# Patient Record
Sex: Male | Born: 1985 | Race: Black or African American | Hispanic: No | Marital: Married | Smoking: Never smoker
Health system: Southern US, Community
[De-identification: ages and names within clinical notes are randomized; demographics above are authoritative.]

## PROBLEM LIST (undated history)

## (undated) HISTORY — PX: NO PAST SURGERIES: SHX2092

---

## 2014-12-20 ENCOUNTER — Other Ambulatory Visit: Payer: Self-pay | Admitting: Internal Medicine

## 2014-12-20 DIAGNOSIS — R7989 Other specified abnormal findings of blood chemistry: Secondary | ICD-10-CM

## 2014-12-20 DIAGNOSIS — R945 Abnormal results of liver function studies: Principal | ICD-10-CM

## 2014-12-21 ENCOUNTER — Ambulatory Visit: Payer: Self-pay

## 2014-12-29 ENCOUNTER — Other Ambulatory Visit: Payer: Self-pay

## 2014-12-30 ENCOUNTER — Ambulatory Visit
Admission: RE | Admit: 2014-12-30 | Discharge: 2014-12-30 | Disposition: A | Discharge status: Home / Self Care | Payer: Managed Care, Other (non HMO) | Source: Ambulatory Visit | Attending: Internal Medicine | Admitting: Internal Medicine

## 2014-12-30 DIAGNOSIS — R7989 Other specified abnormal findings of blood chemistry: Secondary | ICD-10-CM | POA: Insufficient documentation

## 2014-12-30 DIAGNOSIS — R945 Abnormal results of liver function studies: Secondary | ICD-10-CM

## 2015-10-24 IMAGING — US US ABDOMEN LIMITED
1 series · 14 of 25 positions shown · non-contrast
Comparison: None.

CLINICAL DATA: Elevated liver function tests

EXAM:
US ABDOMEN LIMITED - RIGHT UPPER QUADRANT

[Series 1: us abdomen limited · 0.21mm/px · 14 of 91 slices shown]
[im 1/91]
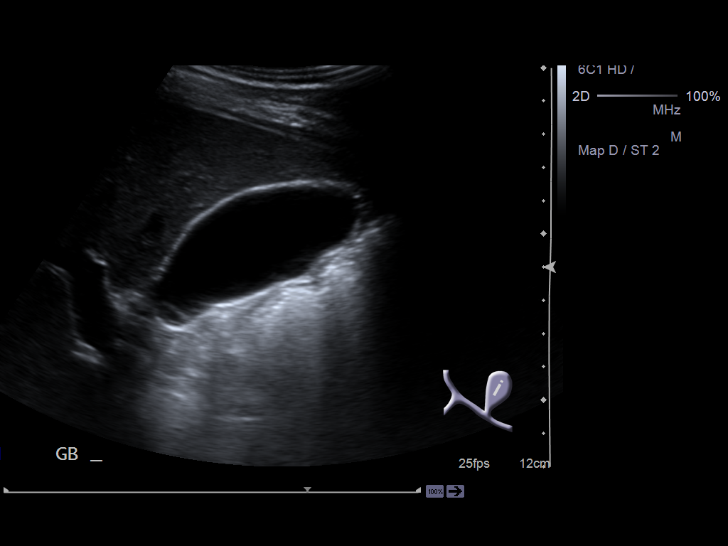
[im 8/91]
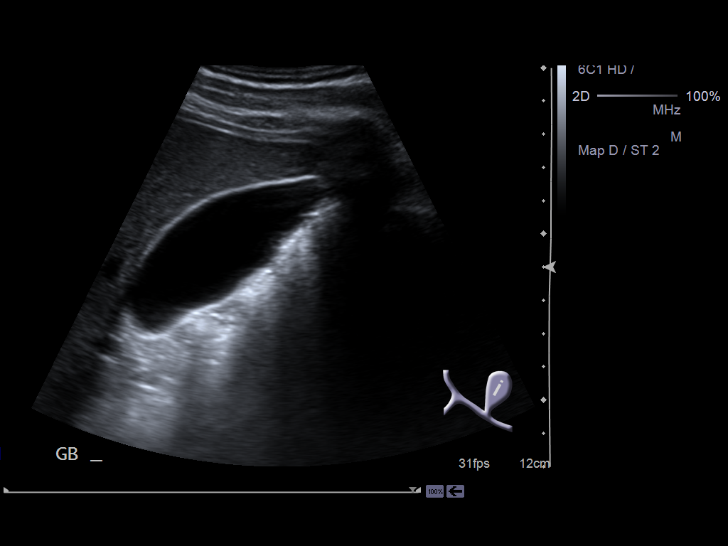
[im 16/91]
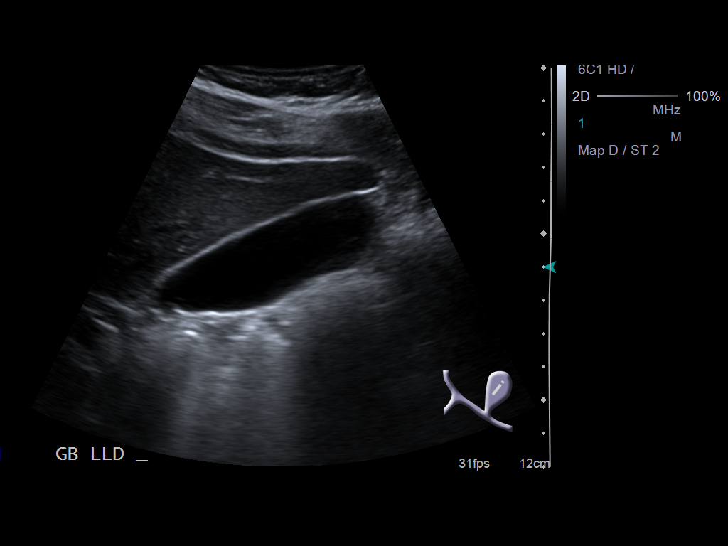
[im 23/91]
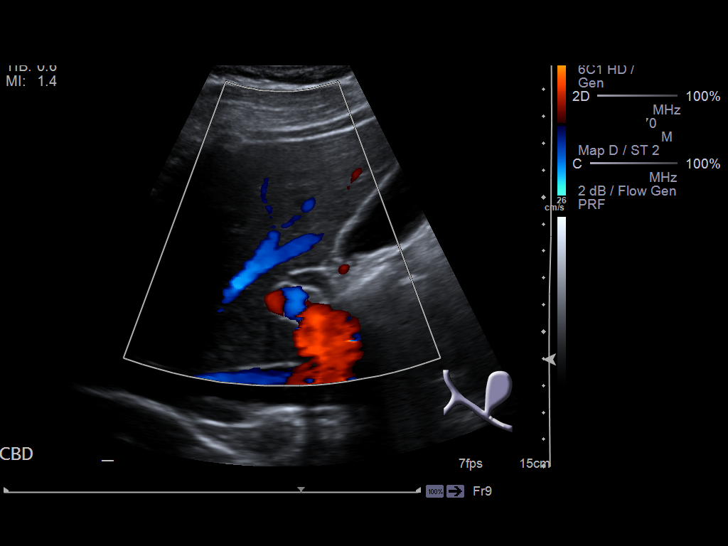
[im 31/91]
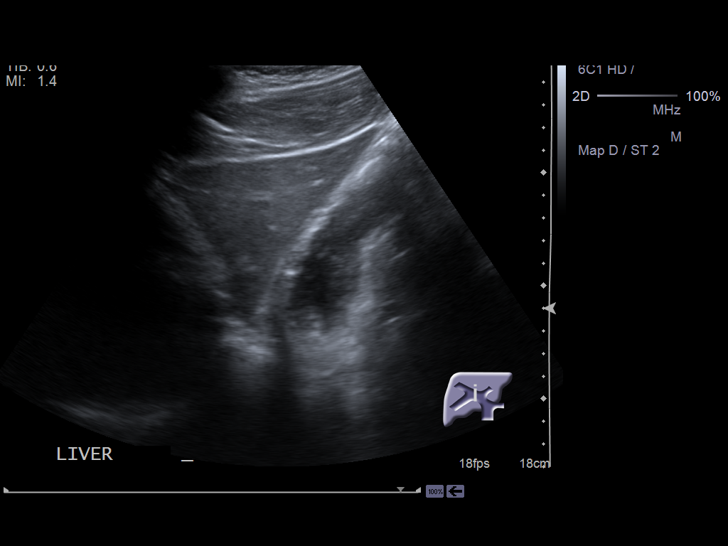
[im 34/91]
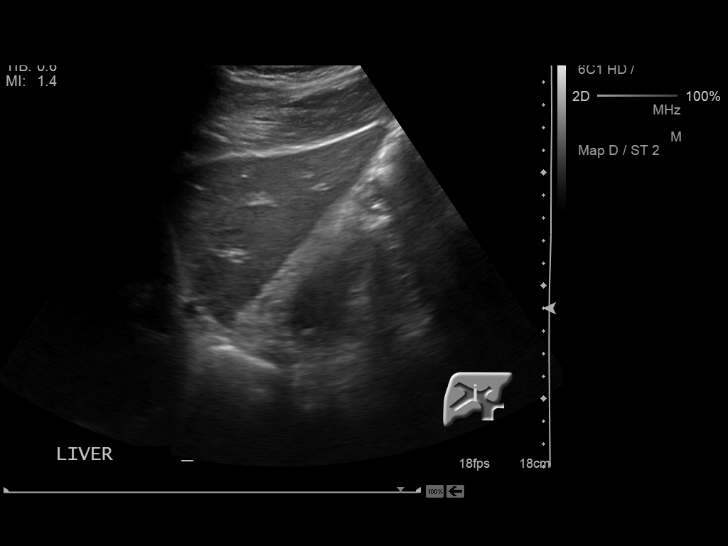
[im 42/91]
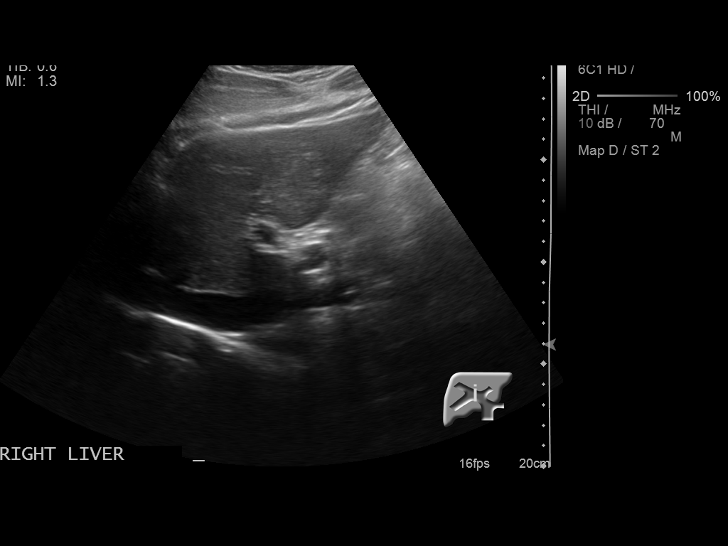
[im 49/91]
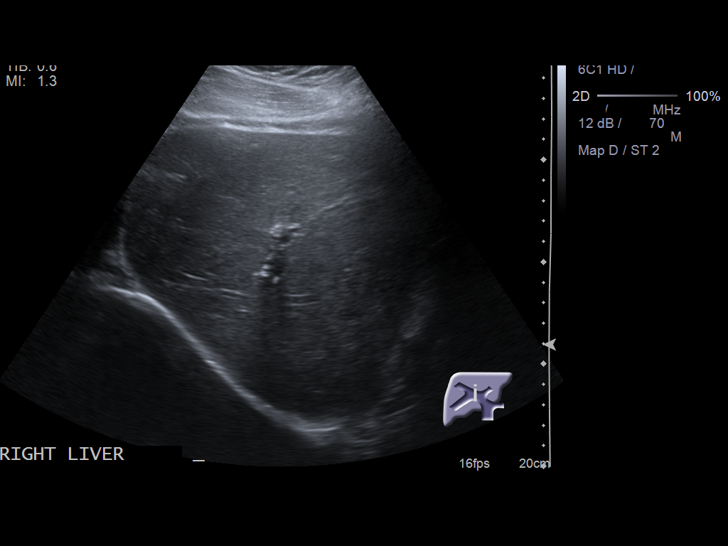
[im 57/91]
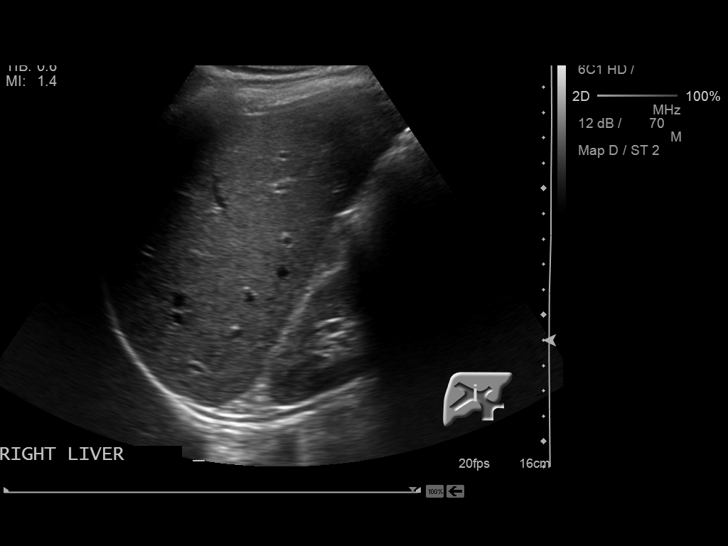
[im 61/91]
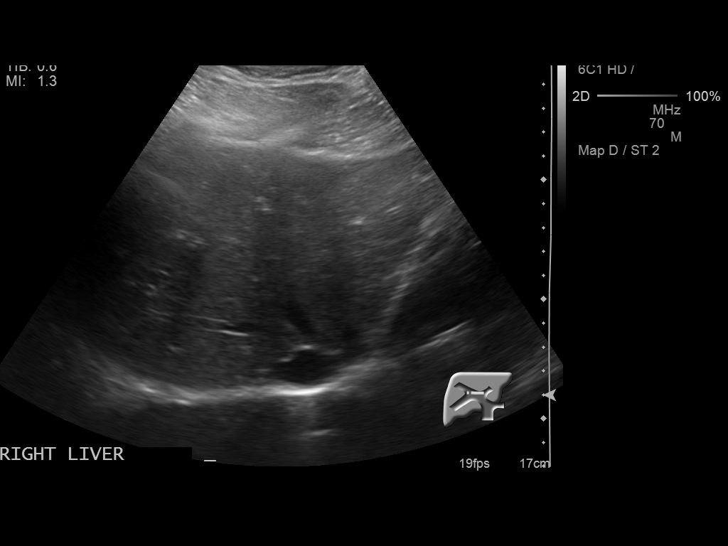
[im 68/91]
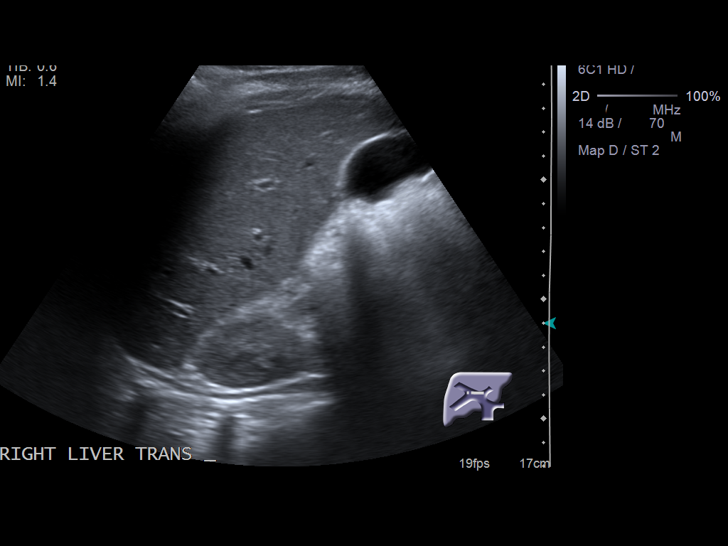
[im 76/91]
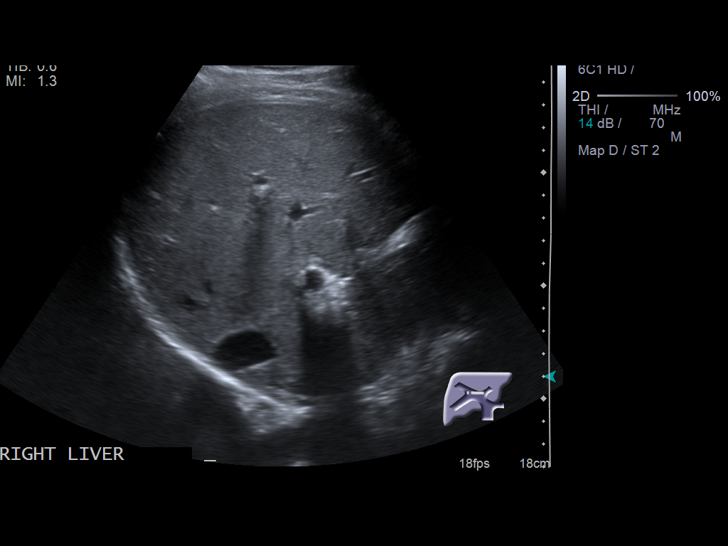
[im 83/91]
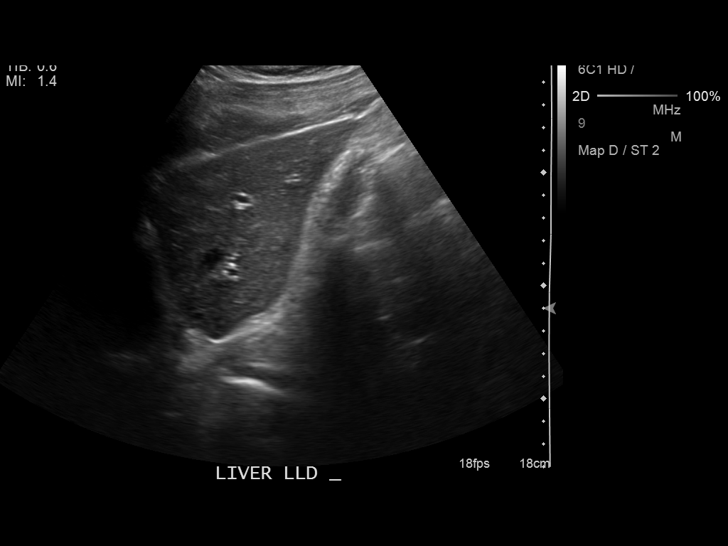
[im 91/91]
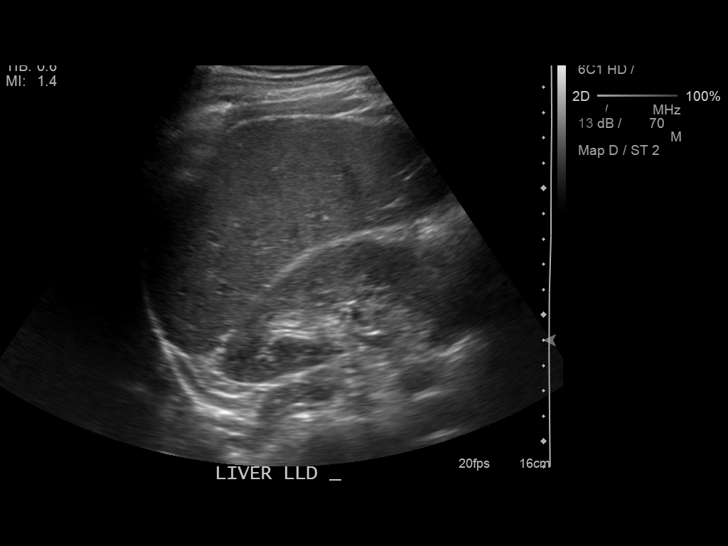

[14 of 25 positions shown; findings below may reference images not displayed]

FINDINGS: Gallbladder:

The gallbladder is visualized and no gallstones are noted. There is
no pain over the gallbladder with compression.

Common bile duct:

Diameter: The common bile duct is normal measuring 6.1 mm.

Liver:

The liver has a normal echogenic pattern. No focal hepatic
abnormality is seen.
IMPRESSION: Negative limited ultrasound of the right upper quadrant.

## 2017-10-02 ENCOUNTER — Ambulatory Visit: Payer: BLUE CROSS/BLUE SHIELD | Admitting: Podiatry

## 2017-10-16 ENCOUNTER — Ambulatory Visit: Payer: Self-pay | Admitting: Internal Medicine

## 2017-10-23 ENCOUNTER — Ambulatory Visit: Payer: BLUE CROSS/BLUE SHIELD | Admitting: Podiatry

## 2017-10-23 ENCOUNTER — Encounter: Payer: Self-pay | Admitting: Podiatry

## 2017-10-23 ENCOUNTER — Ambulatory Visit (INDEPENDENT_AMBULATORY_CARE_PROVIDER_SITE_OTHER): Payer: BLUE CROSS/BLUE SHIELD

## 2017-10-23 VITALS — BP 132/83 | HR 64 | Ht 73.0 in | Wt 245.0 lb

## 2017-10-23 DIAGNOSIS — M2142 Flat foot [pes planus] (acquired), left foot: Secondary | ICD-10-CM | POA: Diagnosis not present

## 2017-10-23 DIAGNOSIS — M2141 Flat foot [pes planus] (acquired), right foot: Secondary | ICD-10-CM | POA: Diagnosis not present

## 2017-10-23 DIAGNOSIS — M778 Other enthesopathies, not elsewhere classified: Secondary | ICD-10-CM

## 2017-10-23 DIAGNOSIS — M79672 Pain in left foot: Secondary | ICD-10-CM

## 2017-10-23 DIAGNOSIS — M7752 Other enthesopathy of left foot: Secondary | ICD-10-CM | POA: Diagnosis not present

## 2017-10-23 DIAGNOSIS — M7751 Other enthesopathy of right foot: Secondary | ICD-10-CM | POA: Diagnosis not present

## 2017-10-23 DIAGNOSIS — M779 Enthesopathy, unspecified: Secondary | ICD-10-CM

## 2017-10-23 DIAGNOSIS — M79671 Pain in right foot: Secondary | ICD-10-CM | POA: Diagnosis not present

## 2017-10-23 MED ORDER — MELOXICAM 15 MG PO TABS: 15.0000 mg | ORAL_TABLET | Freq: Every day | ORAL | 1 refills | Status: AC

## 2017-10-23 NOTE — Progress Notes (Signed)
   Subjective:    Patient ID: Jared ChouCorey Colon, male    DOB: 02/17/1986, 32 y.o.   MRN: [id number]  HPI  Chief Complaint  Patient presents with  . Foot Pain    bilateral foot/heel pain x 1 year. Worse after standing all day. OTC inserts have not helped       Review of Systems  All other systems reviewed and are negative.      Objective:   Physical Exam        Assessment & Plan:

## 2017-10-24 NOTE — Progress Notes (Signed)
   Subjective:  32 year old male presenting today as a new patient with a chief complaint of pain to the bilateral feet and heels that began about one year ago. He states the pain is worse after being ambulatory for long periods of time. He has tried wearing OTC inserts with no significant relief. Patient is here for further evaluation and treatment.   History reviewed. No pertinent past medical history.    Objective/Physical Exam General: The patient is alert and oriented x3 in no acute distress.  Dermatology: Skin is warm, dry and supple bilateral lower extremities. Negative for open lesions or macerations.  Vascular: Palpable pedal pulses bilaterally. No edema or erythema noted. Capillary refill within normal limits.  Neurological: Epicritic and protective threshold grossly intact bilaterally.   Musculoskeletal Exam: Range of motion within normal limits to all pedal and ankle joints bilateral. Muscle strength 5/5 in all groups bilateral.  Upon weightbearing there is a medial longitudinal arch collapse bilaterally. Remove foot valgus noted to the bilateral lower extremities with excessive pronation upon mid stance. Pain with palpation to the feet bilaterally.   Radiographic Exam:  Normal osseous mineralization. Joint spaces preserved. No fracture/dislocation/boney destruction.   Pes planus noted on radiographic exam lateral views. Decreased calcaneal inclination and metatarsal declination angle is noted. Anterior break in the cyma line noted on lateral views. Medial talar head to deviation noted on AP radiograph.   Assessment: #1 pes planus bilateral #2 pain in bilateral feet #3 capsulitis bilateral feet   Plan of Care:  #1 Patient was evaluated. X-Rays reviewed.  #2 Appointment with Raiford Nobleick for custom molded orthotics.  #3 Prescription for Meloxicam provided to patient.  #4 Recommended good shoe gear.  #5 Return to clinic as needed.    Felecia ShellingBrent M. Evans, DPM [facility]  Dr. Felecia ShellingBrent M. Evans, DPM    [address]                                        [place], [place] [address]                Office [phone]  Fax [phone]

## 2017-10-29 ENCOUNTER — Ambulatory Visit (INDEPENDENT_AMBULATORY_CARE_PROVIDER_SITE_OTHER): Payer: BLUE CROSS/BLUE SHIELD | Admitting: Orthotics

## 2017-10-29 DIAGNOSIS — M7751 Other enthesopathy of right foot: Secondary | ICD-10-CM

## 2017-10-29 DIAGNOSIS — M2142 Flat foot [pes planus] (acquired), left foot: Secondary | ICD-10-CM

## 2017-10-29 DIAGNOSIS — M778 Other enthesopathies, not elsewhere classified: Secondary | ICD-10-CM

## 2017-10-29 DIAGNOSIS — M779 Enthesopathy, unspecified: Secondary | ICD-10-CM

## 2017-10-29 DIAGNOSIS — M2141 Flat foot [pes planus] (acquired), right foot: Secondary | ICD-10-CM

## 2017-10-29 DIAGNOSIS — M7752 Other enthesopathy of left foot: Secondary | ICD-10-CM

## 2017-10-29 NOTE — Progress Notes (Signed)
Patient came into today to be cast for Custom Foot Orthotics. Upon recommendation of Dr. Logan BoresEvans  Patient presents with Pes Planus b/l as well as capsulitis Goals are arch support and forefoot cushioning... Plan vendor [employer]

## 2017-11-05 ENCOUNTER — Ambulatory Visit: Payer: BLUE CROSS/BLUE SHIELD | Admitting: Internal Medicine

## 2017-11-05 ENCOUNTER — Encounter: Payer: Self-pay | Admitting: Internal Medicine

## 2017-11-05 VITALS — BP 130/88 | HR 82 | Resp 12 | Ht 73.0 in | Wt 236.0 lb

## 2017-11-05 DIAGNOSIS — Z13220 Encounter for screening for lipoid disorders: Secondary | ICD-10-CM

## 2017-11-05 DIAGNOSIS — Z130 Encounter for screening for diseases of the blood and blood-forming organs and certain disorders involving the immune mechanism: Secondary | ICD-10-CM

## 2017-11-05 DIAGNOSIS — M67431 Ganglion, right wrist: Secondary | ICD-10-CM

## 2017-11-05 DIAGNOSIS — Z6830 Body mass index (BMI) 30.0-30.9, adult: Secondary | ICD-10-CM

## 2017-11-05 DIAGNOSIS — Z131 Encounter for screening for diabetes mellitus: Secondary | ICD-10-CM

## 2017-11-05 NOTE — Progress Notes (Signed)
Subjective:    Patient ID: Jared Colon, male    DOB: 12/12/1985, 32 y.o.   MRN: [id number]  HPI   Here to establish  1.  Ganglion cyst:  Right dorsal hand.  Intermittently gives him a problem.  Was told could have it drained if causes consistent problems.    2.  Increased body mass index, though fairly lean appearing.  Not very physically active.    Current Meds  Medication Sig  . meloxicam (MOBIC) 15 MG tablet Take 1 tablet (15 mg total) by mouth daily.    No Known Allergies   History reviewed. No pertinent past medical history.   History reviewed. No pertinent surgical history.   Family History  Problem Relation Age of Onset  . COPD Mother   . Hypertension Mother   . Asthma Brother     Social History   Socioeconomic History  . Marital status: Single    Spouse name: Joya GaskinsCheryl Farves  . Number of children: 1  . Years of education: 14-second year of online college  . Highest education level: 12th grade  Occupational History  . Occupation: student-data networking  Social Needs  . Financial resource strain: Not hard at all  . Food insecurity:    Worry: Never true    Inability: Never true  . Transportation needs:    Medical: Yes    Non-medical: No  Tobacco Use  . Smoking status: Never Smoker  . Smokeless tobacco: Never Used  Substance and Sexual Activity  . Alcohol use: Yes    Comment: One drink twice weekly  . Drug use: Never  . Sexual activity: Yes  Lifestyle  . Physical activity:    Days per week: 1 day    Minutes per session: 20 min  . Stress: Only a little  Relationships  . Social connections:    Talks on phone: More than three times a week    Gets together: Once a week    Attends religious service: Patient refused    Active member of club or organization: Not on file    Attends meetings of clubs or organizations: Never    Relationship status: Married  . Intimate partner violence:    Fear of current or ex partner: No    Emotionally abused: No   Physically abused: No    Forced sexual activity: No  Other Topics Concern  . Not on file  Social History Narrative   Originally from [place], [place]   Moved to [place] with his wife and daughter Sept 2018      Review of Systems     Objective:   Physical Exam  NAD HEENT:  PERRL, EOMI, TMs pearly gray, throat without injection, Teeth in fairly good health. Neck:  Supple, No adenopathy, no thyromegaly Chest:  CTA CV:  RRR with normal S1 and S2, No S3, S4 or murmur.  Radial and DP pulses normal and equal Abd:  S, NT, No HSM or mass, + BS LE:  No edems Right dorsal hand:  Palpable cystic feeling mass at base of right dorsal hand/wrist area.        Assessment & Plan:  1.  Elevated BMI, though does not appear obese.  High normal BP with family history of hypertension.  Would like for him to work on diet and increase physical activity.  2.  Ganglion cyst, right hand.  To call if becomes a problem  3.  Patient apparently seeing [facility] for pes planus per his  description.  4.  HM:  Tdap up to date.  Recommend yearly flu vaccine and discussed flu clinics. FLP, CBC, CMP today for screening purposes

## 2017-11-05 NOTE — Progress Notes (Signed)
LCSW completed new pt screening with pt. Pt reported that he did not have any mental health symptoms beyond typical stress, which he manages with faith in God. He shared that he did not have concerns with social determinants of health at this time. LCSW provided contact info and information about counseling services available. No follow-up needed at this time.

## 2017-11-05 NOTE — Patient Instructions (Addendum)
Drink a glass of water before every meal Drink 6-8 glasses of water daily Eat three meals daily Eat a protein and healthy fat with every meal (eggs,fish, chicken, turkey and limit red meats) Eat 5 servings of vegetables daily, mix the colors Eat 2 servings of fruit daily with skin, if skin is edible Use smaller plates Put food/utensils down as you chew and swallow each bite Eat at a table with friends/family at least once daily, no TV Do not eat in front of the TV  Recent studies show that people who consume all of their calories in a 12 hour period lose weight more efficiently.  For example, if you eat your first meal at 7:00 a.m., your last meal of the day should be completed by 7:00 p.m.  Can google "advance directives, [place]"  And bring up form from Secretary of State. Print and fill out Or can go to "5 wishes"  Which is also in Spanish and fill out--this costs $5--perhaps easier to use. Designate a Medical Power of Attorney to speak for you if you are unable to speak for yourself when ill or injured  

## 2017-11-06 LAB — CBC WITH DIFFERENTIAL/PLATELET
Basophils Absolute: 0 10*3/uL (ref 0.0–0.2)
Basos: 0 %
EOS (ABSOLUTE): 0.2 10*3/uL (ref 0.0–0.4)
EOS: 4 %
HEMATOCRIT: 45.4 % (ref 37.5–51.0)
Hemoglobin: 14.5 g/dL (ref 13.0–17.7)
Immature Grans (Abs): 0 10*3/uL (ref 0.0–0.1)
Immature Granulocytes: 0 %
LYMPHS ABS: 2 10*3/uL (ref 0.7–3.1)
Lymphs: 43 %
MCH: 29 pg (ref 26.6–33.0)
MCHC: 31.9 g/dL (ref 31.5–35.7)
MCV: 91 fL (ref 79–97)
MONOS ABS: 0.3 10*3/uL (ref 0.1–0.9)
Monocytes: 7 %
Neutrophils Absolute: 2.2 10*3/uL (ref 1.4–7.0)
Neutrophils: 46 %
Platelets: 235 10*3/uL (ref 150–379)
RBC: 5 x10E6/uL (ref 4.14–5.80)
RDW: 14.3 % (ref 12.3–15.4)
WBC: 4.7 10*3/uL (ref 3.4–10.8)

## 2017-11-06 LAB — LIPID PANEL W/O CHOL/HDL RATIO
CHOLESTEROL TOTAL: 185 mg/dL (ref 100–199)
HDL: 53 mg/dL (ref >39)
LDL Calculated: 112 mg/dL — ABNORMAL HIGH (ref 0–99)
Triglycerides: 101 mg/dL (ref 0–149)
VLDL Cholesterol Cal: 20 mg/dL (ref 5–40)

## 2017-11-06 LAB — COMPREHENSIVE METABOLIC PANEL
A/G RATIO: 1.8 (ref 1.2–2.2)
ALT: 33 IU/L (ref 0–44)
AST: 22 IU/L (ref 0–40)
Albumin: 4.8 g/dL (ref 3.5–5.5)
Alkaline Phosphatase: 75 IU/L (ref 39–117)
BUN/Creatinine Ratio: 9 (ref 9–20)
BUN: 11 mg/dL (ref 6–20)
Bilirubin Total: 0.5 mg/dL (ref 0.0–1.2)
CO2: 24 mmol/L (ref 20–29)
Calcium: 9.8 mg/dL (ref 8.7–10.2)
Chloride: 101 mmol/L (ref 96–106)
Creatinine, Ser: 1.2 mg/dL (ref 0.76–1.27)
GFR calc Af Amer: 93 mL/min/{1.73_m2} (ref >59)
GFR calc non Af Amer: 81 mL/min/{1.73_m2} (ref >59)
GLOBULIN, TOTAL: 2.7 g/dL (ref 1.5–4.5)
Glucose: 92 mg/dL (ref 65–99)
Potassium: 4.7 mmol/L (ref 3.5–5.2)
SODIUM: 142 mmol/L (ref 134–144)
Total Protein: 7.5 g/dL (ref 6.0–8.5)

## 2017-11-19 ENCOUNTER — Other Ambulatory Visit: Payer: BLUE CROSS/BLUE SHIELD | Admitting: Orthotics

## 2017-12-02 ENCOUNTER — Other Ambulatory Visit: Payer: BLUE CROSS/BLUE SHIELD | Admitting: Orthotics

## 2017-12-05 ENCOUNTER — Ambulatory Visit (INDEPENDENT_AMBULATORY_CARE_PROVIDER_SITE_OTHER): Payer: Managed Care, Other (non HMO) | Admitting: Orthotics

## 2017-12-05 DIAGNOSIS — M2142 Flat foot [pes planus] (acquired), left foot: Secondary | ICD-10-CM

## 2017-12-05 DIAGNOSIS — M779 Enthesopathy, unspecified: Secondary | ICD-10-CM | POA: Diagnosis not present

## 2017-12-05 DIAGNOSIS — M2141 Flat foot [pes planus] (acquired), right foot: Secondary | ICD-10-CM

## 2017-12-05 DIAGNOSIS — M778 Other enthesopathies, not elsewhere classified: Secondary | ICD-10-CM

## 2017-12-06 NOTE — Progress Notes (Signed)
Patient came in today to pick up custom made foot orthotics.  The goals were accomplished and the patient reported no dissatisfaction with said orthotics.  Patient was advised of breakin period and how to report any issues. 

## 2018-08-25 ENCOUNTER — Ambulatory Visit (HOSPITAL_COMMUNITY)
Admission: EM | Admit: 2018-08-25 | Discharge: 2018-08-25 | Disposition: A | Discharge status: Home / Self Care | Payer: Managed Care, Other (non HMO) | Attending: Urgent Care | Admitting: Urgent Care

## 2018-08-25 DIAGNOSIS — R070 Pain in throat: Secondary | ICD-10-CM | POA: Diagnosis not present

## 2018-08-25 DIAGNOSIS — J018 Other acute sinusitis: Secondary | ICD-10-CM | POA: Diagnosis not present

## 2018-08-25 MED ORDER — AMOXICILLIN 500 MG PO CAPS: 500.0000 mg | ORAL_CAPSULE | Freq: Three times a day (TID) | ORAL | 0 refills | Status: DC

## 2018-08-25 NOTE — Discharge Instructions (Addendum)
For sore throat try using a honey-based tea. Use 3 teaspoons of honey with juice squeezed from half lemon. Place shaved pieces of ginger into 1/2-1 cup of water and warm over stove top. Then mix the ingredients and repeat every 4 hours as needed. Please take Tylenol 500mg  every 6 hours for general aches and pains. Hydrate very well with at least 2 liters of water. Eat light meals such as soups to replenish electrolytes and get good nutrition. Start an antihistamine like Zyrtec, Allegra or Claritin. Sudafed (pseudoephedrine) 60-120mg  twice daily as needed can also help with your nasal congestion.

## 2018-08-25 NOTE — ED Provider Notes (Addendum)
MRN: [id number] DOB: 02/01/1986  Subjective:   Jared Colon is a 33 y.o. male presenting for 1 week history of scratchy/itchy persistent throat discomfort that was initially painful but improved. Has tried Benadryl, NyQuil, otc medications with minimal-some relief. He is not currently taking any medications and has no known food or drug allergies.  Denies past medical and surgical history. Denies smoking cigarettes.   Review of Systems  Constitutional: Positive for malaise/fatigue. Negative for chills and fever.  HENT: Positive for congestion. Negative for ear discharge, ear pain and sinus pain.   Eyes: Positive for discharge (mildly water). Negative for blurred vision, double vision, pain and redness.  Respiratory: Positive for cough (mildly productive). Negative for shortness of breath and wheezing.   Cardiovascular: Negative for chest pain and palpitations.  Gastrointestinal: Negative for abdominal pain, diarrhea, nausea and vomiting.  Genitourinary: Negative for dysuria and urgency.  Musculoskeletal: Negative for myalgias.  Skin: Negative for rash.  Neurological: Negative for dizziness and headaches.  Psychiatric/Behavioral: Negative for depression and substance abuse.   Objective:   Vitals: BP (!) 143/85 (BP Location: Left Arm)   Pulse 83   Temp 98.8 F (37.1 C) (Oral)   Resp 16   SpO2 100%   Physical Exam Constitutional:      General: He is not in acute distress.    Appearance: Normal appearance. He is well-developed and normal weight. He is not ill-appearing, toxic-appearing or diaphoretic.  HENT:     Head: Normocephalic and atraumatic.     Right Ear: Tympanic membrane, ear canal and external ear normal. There is no impacted cerumen.     Left Ear: Tympanic membrane, ear canal and external ear normal. There is no impacted cerumen.     Nose: Congestion and rhinorrhea present.     Comments: Mild sinus tenderness bilaterally over maxillary area.    Mouth/Throat:     Mouth:  Mucous membranes are moist.     Pharynx: Oropharynx is clear. No oropharyngeal exudate or posterior oropharyngeal erythema.     Comments: Thick streaks of postnasal drainage on throat. Eyes:     General: No scleral icterus.       Right eye: No discharge.        Left eye: No discharge.     Extraocular Movements: Extraocular movements intact.     Conjunctiva/sclera: Conjunctivae normal.     Pupils: Pupils are equal, round, and reactive to light.  Neck:     Musculoskeletal: Normal range of motion and neck supple. No neck rigidity or muscular tenderness.  Cardiovascular:     Rate and Rhythm: Normal rate and regular rhythm.     Heart sounds: Normal heart sounds. No murmur. No friction rub. No gallop.   Pulmonary:     Effort: Pulmonary effort is normal. No respiratory distress.     Breath sounds: Normal breath sounds. No stridor. No wheezing, rhonchi or rales.  Skin:    General: Skin is warm and dry.  Neurological:     General: No focal deficit present.     Mental Status: He is alert and oriented to person, place, and time.  Psychiatric:        Mood and Affect: Mood normal.        Behavior: Behavior normal.        Thought Content: Thought content normal.     Assessment and Plan :   Acute non-recurrent sinusitis of other sinus  Throat pain  Will cover for maxillary sinusitis with amoxicillin.  Use supportive care  otherwise. Return-to-clinic precautions discussed, patient verbalized understanding.    Wallis BambergMani, Jarone Ostergaard, PA-C 08/25/18 0959    Wallis BambergMani, Trevion Hoben, PA-C 08/25/18 [phone]

## 2018-08-25 NOTE — ED Triage Notes (Signed)
Seen by provider

## 2019-08-19 ENCOUNTER — Ambulatory Visit: Payer: BLUE CROSS/BLUE SHIELD | Attending: Internal Medicine

## 2019-08-19 DIAGNOSIS — Z20822 Contact with and (suspected) exposure to covid-19: Secondary | ICD-10-CM

## 2019-08-20 LAB — NOVEL CORONAVIRUS, NAA: SARS-CoV-2, NAA: NOT DETECTED

## 2019-09-02 ENCOUNTER — Ambulatory Visit: Payer: BLUE CROSS/BLUE SHIELD | Attending: Internal Medicine

## 2019-09-02 DIAGNOSIS — Z20822 Contact with and (suspected) exposure to covid-19: Secondary | ICD-10-CM

## 2019-09-03 LAB — NOVEL CORONAVIRUS, NAA: SARS-CoV-2, NAA: NOT DETECTED

## 2019-09-29 ENCOUNTER — Ambulatory Visit: Payer: BLUE CROSS/BLUE SHIELD | Attending: Internal Medicine

## 2019-09-29 DIAGNOSIS — Z20822 Contact with and (suspected) exposure to covid-19: Secondary | ICD-10-CM

## 2019-09-30 LAB — NOVEL CORONAVIRUS, NAA: SARS-CoV-2, NAA: NOT DETECTED

## 2019-10-16 ENCOUNTER — Ambulatory Visit: Payer: Self-pay | Attending: Internal Medicine

## 2019-10-16 DIAGNOSIS — Z20822 Contact with and (suspected) exposure to covid-19: Secondary | ICD-10-CM

## 2019-10-17 LAB — NOVEL CORONAVIRUS, NAA: SARS-CoV-2, NAA: NOT DETECTED

## 2020-08-26 ENCOUNTER — Ambulatory Visit (HOSPITAL_COMMUNITY): Admit: 2020-08-26 | Discharge status: Against Medical Advice | Payer: BLUE CROSS/BLUE SHIELD

## 2020-11-26 DIAGNOSIS — Y9389 Activity, other specified: Secondary | ICD-10-CM | POA: Diagnosis not present

## 2020-11-26 DIAGNOSIS — W274XXA Contact with kitchen utensil, initial encounter: Secondary | ICD-10-CM | POA: Diagnosis not present

## 2020-11-26 DIAGNOSIS — S61011A Laceration without foreign body of right thumb without damage to nail, initial encounter: Secondary | ICD-10-CM | POA: Diagnosis not present

## 2020-11-26 DIAGNOSIS — S6991XA Unspecified injury of right wrist, hand and finger(s), initial encounter: Secondary | ICD-10-CM | POA: Diagnosis present

## 2020-11-27 ENCOUNTER — Emergency Department (HOSPITAL_COMMUNITY): Payer: 59

## 2020-11-27 ENCOUNTER — Encounter (HOSPITAL_COMMUNITY): Payer: Self-pay

## 2020-11-27 ENCOUNTER — Emergency Department (HOSPITAL_COMMUNITY)
Admission: EM | Admit: 2020-11-27 | Discharge: 2020-11-27 | Disposition: A | Discharge status: Home / Self Care | Payer: 59 | Attending: Emergency Medicine | Admitting: Emergency Medicine

## 2020-11-27 DIAGNOSIS — S61111A Laceration without foreign body of right thumb with damage to nail, initial encounter: Secondary | ICD-10-CM

## 2020-11-27 MED ORDER — BACITRACIN ZINC 500 UNIT/GM EX OINT
TOPICAL_OINTMENT | Freq: Once | CUTANEOUS | Status: AC
  Filled 2020-11-27: qty 1.8

## 2020-11-27 NOTE — ED Provider Notes (Signed)
[facility] EM[facility]GENCY DEPARTMENT Provider Note   CSN: [id number] Arrival date & time: 11/26/20  2301     History Chief Complaint  Patient presents with  . Laceration    Jared Colon is a 35 y.o. male with no relevant past medical history who presents the ED after sustaining laceration to right thumb.  On my examination, patient reports that he was using a mandolin to slice sweet potatoes last evening when he cut off a piece of his thumb.  He endorses nail plate involvement.  He denies any numbness or weakness.  The injury occurred at approximately 8 PM last evening.  He has been in the ER for 9 hours prior to my initial examination.  He is not on any blood thinners.  Bleeding is relatively well controlled with a wrap that was administered by his wife who is a Engineer, site.  He is right-hand dominant.  He works at a pharmacy.  Immunization with tetanus up-to-date.  HPI     History reviewed. No pertinent past medical history.  Patient Active Problem List   Diagnosis Date Noted  . Ganglion cyst of wrist, right 11/05/2017    History reviewed. No pertinent surgical history.     Family History  Problem Relation Age of Onset  . COPD Mother   . Hypertension Mother   . Asthma Brother     Social History   Tobacco Use  . Smoking status: Never Smoker  . Smokeless tobacco: Never Used  Vaping Use  . Vaping Use: Never used  Substance Use Topics  . Alcohol use: Yes    Comment: One drink twice weekly  . Drug use: Never    Home Medications Prior to Admission medications   Medication Sig Start Date End Date Taking? Authorizing Provider  amoxicillin (AMOXIL) 500 MG capsule Take 1 capsule (500 mg total) by mouth 3 (three) times daily. 08/25/18   Jared Bamberg, PA-C    Allergies    Patient has no known allergies.  Review of Systems   Review of Systems  All other systems reviewed and are negative.   Physical Exam Updated Vital Signs BP 139/86 (BP  Location: Left Arm)   Pulse 69   Temp 98.1 F (36.7 C)   Resp 17   SpO2 100%   Physical Exam Vitals and nursing note reviewed. Exam conducted with a chaperone present.  Constitutional:      General: He is not in acute distress.    Appearance: Normal appearance. He is not ill-appearing.  HENT:     Head: Normocephalic and atraumatic.  Eyes:     General: No scleral icterus.    Conjunctiva/sclera: Conjunctivae normal.  Cardiovascular:     Rate and Rhythm: Normal rate.  Pulmonary:     Effort: Pulmonary effort is normal. No respiratory distress.  Musculoskeletal:     Comments: Right hand, thumb: Mildly bleeding partial amputation versus avulsion injury to distal aspect of thumb, partially involving distal nail plate.  No nailbed involvement.  Mild oozing, but bleeding well controlled with wrap.  No arterial bleed.  Nothing to approximate.  Sensation intact.  ROM fully intact.  Skin:    General: Skin is dry.  Neurological:     Mental Status: He is alert.     GCS: GCS eye subscore is 4. GCS verbal subscore is 5. GCS motor subscore is 6.  Psychiatric:        Mood and Affect: Mood normal.        Behavior:  Behavior normal.        Thought Content: Thought content normal.          ED Results / Procedures / Treatments   Labs (all labs ordered are listed, but only abnormal results are displayed) Labs Reviewed - No data to display  EKG None  Radiology DG Finger Thumb Right  Result Date: 11/27/2020 CLINICAL DATA:  Right thumb wound, laceration while cutting a sweep potato EXAM: RIGHT THUMB 2+V COMPARISON:  None. FINDINGS: Soft tissue irregularity of the first digit compatible with reported laceration. Overlying bandaging material is present. No acute fracture or osseous defect is seen. No other suspicious osseous abnormality. IMPRESSION: 1. No acute osseous abnormality. 2. Soft tissue irregularity of the first digit compatible with reported laceration. Electronically Signed   By:  Jared Colon M.D.   On: 11/27/2020 01:42    Procedures Procedures   Medications Ordered in ED Medications  bacitracin ointment (has no administration in time range)    ED Course  I have reviewed the triage vital signs and the nursing notes.  Pertinent labs & imaging results that were available during my care of the patient were reviewed by me and considered in my medical decision making (see chart for details).    MDM Rules/Calculators/A&P                          Jared Colon was evaluated in Emergency Department on 11/27/2020 for the symptoms described in the history of present illness. He was evaluated in the context of the global COVID-19 pandemic, which necessitated consideration that the patient might be at risk for infection with the SARS-CoV-2 virus that causes COVID-19. Institutional protocols and algorithms that pertain to the evaluation of patients at risk for COVID-19 are in a state of rapid change based on information released by regulatory bodies including the CDC and federal and state organizations. These policies and algorithms were followed during the patient's care in the ED.  I personally reviewed patient's medical chart and all notes from triage and staff during today's encounter. I have also ordered and reviewed all labs and imaging that I felt to be medically necessary in the evaluation of this patient's complaints and with consideration of their physical exam. If needed, translation services were available and utilized.   Patient with partial amputation/avulsion injury to distal radial aspect of thumb, right hand.  He is right-hand dominant.  He is functionally and neurovascularly intact.  There is nothing for me to repair.  We will clean here in the ED and wrap.  I cautioned patient on using Coban wraps on his thumb, especially if on for prolonged period of time or too constricted as it can cause ischemia.  Patient reports that he has a primary care provider with  whom he can follow-up.  I would like him to see them in 3 days for wound check.  Do not feel as though empiric systemic antibiotics are warranted.  Instead, recommending topical antibiotic such as bacitracin.  ED return precautions discussed.  Patient voices understanding and is agreeable to the plan.  Final Clinical Impression(s) / ED Diagnoses Final diagnoses:  Laceration of right thumb without foreign body with damage to nail, initial encounter    Rx / DC Orders ED Discharge Orders    None       Elvera Maria 11/27/20 0843    Cheryll Cockayne, MD 11/28/20 [phone]

## 2020-11-27 NOTE — ED Triage Notes (Signed)
Emergency Medicine Provider Triage Evaluation Note  Jashaun Penrose , a 35 y.o. male  was evaluated in triage.  Pt complains of right thumb laceration after the patient cut his finger while using a mandolin to slice a sweet potato earlier tonight.  He denies numbness, weakness fever, chills.  He has been able to move the finger.  He has right-hand-dominant.  He is unsure when his tetanus was last updated, but per chart review his tetanus was updated in 2016.  Review of Systems  Positive: Wound Negative: Numbness, weakness, fever, chills  Physical Exam  BP (!) 143/86   Pulse 63   Temp 97.8 F (36.6 C) (Oral)   Resp 18   SpO2 100%  Gen:   Awake, no distress   HEENT:  Atraumatic  Resp:  Normal effort  Cardiac:  Normal rate  Abd:   Nondistended, nontender  MSK:   Moves extremities without difficulty, laceration noted to the distal right thumb.  Full active and passive range of motion of the right thumb.  Sensations intact all 4 distal tips of the right thumb.  2+ radial pulses.  Good capillary refill. Neuro:  Speech clear   Medical Decision Making  Medically screening exam initiated at 1:09 AM.  Appropriate orders placed.  Rossie Bretado was informed that the remainder of the evaluation will be completed by another provider, this initial triage assessment does not replace that evaluation, and the importance of remaining in the ED until their evaluation is complete.  Clinical Impression  35 year old male presenting with a laceration to the right thumb.  He is right-hand dominant.  He has neurovascular intact.  Per chart review, Tdap is up-to-date.  Will order x-ray of the right thumb.    Tanna Loeffler A, PA-C 11/27/20 0110

## 2020-11-27 NOTE — Discharge Instructions (Signed)
You have an avulsion injury to your thumb that cannot be repaired with sutures.  Unfortunately, the best thing that we can do is apply topical antibiotics and apply nonadherent dressings.  Your wife did an excellent job with your wrap, but I would exercise caution with Coban wraps.  If it is too tight, it can impair blood flow to the thumb and cause ischemia.  Please be sure to change your wraps regularly.  Apply bacitracin or similar topical antibiotic twice daily.  Keep it moist, clean, and covered.  I would like for you to follow-up with your primary care provider in 3 days for wound check.  Given that it has been a couple of years since she last saw them, it is that much more important that you reestablish care.  Return to the ED or seek immediate medical attention should you experience any new or worsening symptoms.

## 2020-11-27 NOTE — ED Notes (Signed)
Patient Alert and oriented to baseline. Stable and ambulatory to baseline. Patient verbalized understanding of the discharge instructions.  Patient belongings were taken by the patient.   

## 2020-11-27 NOTE — ED Triage Notes (Signed)
Pt states that he was cutting a sweet potato and cut his R thumb, bleeding controlled, last tetanus unknown

## 2020-12-02 ENCOUNTER — Encounter (HOSPITAL_COMMUNITY): Payer: Self-pay | Admitting: Emergency Medicine

## 2020-12-02 ENCOUNTER — Ambulatory Visit (HOSPITAL_COMMUNITY): Payer: 59

## 2020-12-02 ENCOUNTER — Other Ambulatory Visit: Payer: Self-pay

## 2020-12-02 ENCOUNTER — Ambulatory Visit (HOSPITAL_COMMUNITY)
Admission: EM | Admit: 2020-12-02 | Discharge: 2020-12-02 | Disposition: A | Discharge status: Home / Self Care | Payer: 59 | Attending: Physician Assistant | Admitting: Physician Assistant

## 2020-12-02 DIAGNOSIS — S61101D Unspecified open wound of right thumb with damage to nail, subsequent encounter: Secondary | ICD-10-CM | POA: Diagnosis not present

## 2020-12-02 MED ORDER — BACITRACIN ZINC 500 UNIT/GM EX OINT: 1.0000 "application " | TOPICAL_OINTMENT | Freq: Two times a day (BID) | CUTANEOUS | 0 refills | Status: DC

## 2020-12-02 NOTE — ED Triage Notes (Addendum)
Patient c/o RT thumb injury that occurred 6 days ago. Patient is reporting to clinic for follow-up evaluation.   Patient endorses he was seen by a provider at [facility] and was told to follow up. Patient reports no sutures were placed.   Patient denies any increased pain, redness, or drainage from site.   Patient states " I've been wrapping it up and keeping it clean, using ointment".

## 2020-12-02 NOTE — ED Provider Notes (Signed)
[facility]    CSN: [id number] Arrival date & time: 12/02/20  0850      History   Chief Complaint Chief Complaint  Patient presents with  . Follow-up    HPI Jared Colon is a 35 y.o. male.   Patient presents today for 5-day wound check.  On 11/27/2020 patient was slicing potatoes when he removed a portion of his right thumb.  He was seen in the emergency room at which point no sutures were placed and area was cleaned and dressed.  He is up-to-date on tetanus.  No antibiotics were prescribed.  Since his ER visit he has been keeping affected area clean and covered.  He is using bacitracin as recommended and is requesting a refill of this medication.  He denies any weakness, numbness, bleeding, drainage, increased pain.  Reports only mild tenderness palpation but denies any pain at rest.  He is right-handed.  He is able to perform daily activities despite symptoms.     History reviewed. No pertinent past medical history.  Patient Active Problem List   Diagnosis Date Noted  . Ganglion cyst of wrist, right 11/05/2017    History reviewed. No pertinent surgical history.     Home Medications    Prior to Admission medications   Medication Sig Start Date End Date Taking? Authorizing Provider  bacitracin ointment Apply 1 application topically 2 (two) times daily. 12/02/20  Yes Dillyn Joaquin K, PA-C  amoxicillin (AMOXIL) 500 MG capsule Take 1 capsule (500 mg total) by mouth 3 (three) times daily. 08/25/18   Wallis Bamberg, PA-C    Family History Family History  Problem Relation Age of Onset  . COPD Mother   . Hypertension Mother   . Asthma Brother     Social History Social History   Tobacco Use  . Smoking status: Never Smoker  . Smokeless tobacco: Never Used  Vaping Use  . Vaping Use: Never used  Substance Use Topics  . Alcohol use: Yes    Comment: One drink twice weekly  . Drug use: Never     Allergies   Patient has no known allergies.   Review of  Systems Review of Systems  Constitutional: Negative for activity change, appetite change, fatigue and fever.  Musculoskeletal: Negative for arthralgias and myalgias.  Skin: Positive for wound. Negative for color change.  Neurological: Negative for dizziness, weakness, light-headedness, numbness and headaches.     Physical Exam Triage Vital Signs ED Triage Vitals  Enc Vitals Group     BP 12/02/20 0903 127/87     Pulse Rate 12/02/20 0903 93     Resp 12/02/20 0903 17     Temp 12/02/20 0903 98.3 F (36.8 C)     Temp Source 12/02/20 0903 Oral     SpO2 12/02/20 0903 96 %     Weight --      Height --      Head Circumference --      Peak Flow --      Pain Score 12/02/20 0902 0     Pain Loc --      Pain Edu? --      Excl. in GC? --    No data found.  Updated Vital Signs BP 127/87 (BP Location: Right Arm)   Pulse 93   Temp 98.3 F (36.8 C) (Oral)   Resp 17   SpO2 96%   Visual Acuity Right Eye Distance:   Left Eye Distance:   Bilateral Distance:    Right  Eye Near:   Left Eye Near:    Bilateral Near:     Physical Exam Vitals reviewed.  Constitutional:      General: He is awake.     Appearance: Normal appearance. He is normal weight. He is not ill-appearing.     Comments: Very pleasant male appears stated age in no acute distress  HENT:     Head: Normocephalic and atraumatic.  Cardiovascular:     Rate and Rhythm: Normal rate and regular rhythm.     Heart sounds: No murmur heard.     Comments: Capillary refill within 2 seconds bilateral hands Pulmonary:     Effort: Pulmonary effort is normal.     Breath sounds: Normal breath sounds. No stridor. No wheezing, rhonchi or rales.     Comments: Clear to auscultation bilaterally Abdominal:     General: Bowel sounds are normal.     Palpations: Abdomen is soft.     Tenderness: There is no abdominal tenderness.  Musculoskeletal:     Right hand: Normal range of motion. Normal strength. Normal sensation. There is no  disruption of two-point discrimination.     Left hand: Normal range of motion. Normal strength. Normal sensation. There is no disruption of two-point discrimination.     Comments: He is neurovascularly intact.  Normal active range of motion.  Skin:    Findings: Laceration present.     Comments: 0.5 cm laceration noted lateral right.  No bleeding, drainage, erythema.  Neurological:     Mental Status: He is alert.  Psychiatric:        Behavior: Behavior is cooperative.         UC Treatments / Results  Labs (all labs ordered are listed, but only abnormal results are displayed) Labs Reviewed - No data to display  EKG   Radiology No results found.  Procedures Procedures (including critical care time)  Medications Ordered in UC Medications - No data to display  Initial Impression / Assessment and Plan / UC Course  I have reviewed the triage vital signs and the nursing notes.  Pertinent labs & imaging results that were available during my care of the patient were reviewed by me and considered in my medical decision making (see chart for details).      No evidence of infection that would warrant initiation of antibiotics.  Wound appears to be healing appropriately.  Refill of bacitracin provided as requested by patient.  He was encouraged to keep affected area clean and use medication with dressing changes.  Discussed the importance of using loosefitting wraps to prevent ischemia.  Strict return precautions given to which patient expressed understanding.  Final Clinical Impressions(s) / UC Diagnoses   Final diagnoses:  Unspecified open wound of right thumb with damage to nail, subsequent encounter     Discharge Instructions     Your wound appears to be healing appropriately.  Please continue with regular dressing changes as we discussed.  If you develop any signs of infection (redness, drainage, swelling, increased pain) please be reevaluated.  Continue to protect lesion  until this heals.    ED Prescriptions    Medication Sig Dispense Auth. Provider   bacitracin ointment Apply 1 application topically 2 (two) times daily. 120 g Larya Charpentier K, PA-C     PDMP not reviewed this encounter.   Jeani Hawking, PA-C 12/02/20 [id number]

## 2020-12-02 NOTE — Discharge Instructions (Signed)
Your wound appears to be healing appropriately.  Please continue with regular dressing changes as we discussed.  If you develop any signs of infection (redness, drainage, swelling, increased pain) please be reevaluated.  Continue to protect lesion until this heals.

## 2021-01-23 ENCOUNTER — Encounter: Payer: Self-pay | Admitting: Physician Assistant

## 2021-01-23 ENCOUNTER — Telehealth: Payer: 59 | Admitting: Physician Assistant

## 2021-01-23 DIAGNOSIS — J020 Streptococcal pharyngitis: Secondary | ICD-10-CM | POA: Diagnosis not present

## 2021-01-23 MED ORDER — AMOXICILLIN 500 MG PO CAPS: 500.0000 mg | ORAL_CAPSULE | Freq: Three times a day (TID) | ORAL | 0 refills | Status: DC

## 2021-01-23 NOTE — Progress Notes (Signed)
Mr. Jared, Colon are scheduled for a virtual visit with your provider today.    Just as we do with appointments in the office, we must obtain your consent to participate.  Your consent will be active for this visit and any virtual visit you may have with one of our providers in the next 365 days.    If you have a MyChart account, I can also send a copy of this consent to you electronically.  All virtual visits are billed to your insurance company just like a traditional visit in the office.  As this is a virtual visit, video technology does not allow for your provider to perform a traditional examination.  This may limit your provider's ability to fully assess your condition.  If your provider identifies any concerns that need to be evaluated in person or the need to arrange testing such as labs, EKG, etc, we will make arrangements to do so.    Although advances in technology are sophisticated, we cannot ensure that it will always work on either your end or our end.  If the connection with a video visit is poor, we may have to switch to a telephone visit.  With either a video or telephone visit, we are not always able to ensure that we have a secure connection.   I need to obtain your verbal consent now.   Are you willing to proceed with your visit today?   [name] has provided verbal consent on 01/23/2021 for a virtual visit (video or telephone).   Margaretann Loveless, PA-C 01/23/2021  9:23 AM    MyChart Video Visit    Virtual Visit via Video Note   This visit type was conducted due to national recommendations for restrictions regarding the COVID-19 Pandemic (e.g. social distancing) in an effort to limit this patient's exposure and mitigate transmission in our community. This patient is at least at moderate risk for complications without adequate follow up. This format is felt to be most appropriate for this patient at this time. Physical exam was limited by quality of the video and audio  technology used for the visit.   Patient location: Home Provider location: Home office in [place]  I discussed the limitations of evaluation and management by telemedicine and the availability of in person appointments. The patient expressed understanding and agreed to proceed.  Patient: Jared Colon   DOB: [birth year]   35 y.o. Male  MRN: [id number] Visit Date: 01/23/2021  Today's healthcare provider: Margaretann Loveless, PA-C   No chief complaint on file.  Subjective    Sore Throat  This is a new problem. The current episode started in the past 7 days (symptoms started Wednesday). The problem has been unchanged. Neither side of throat is experiencing more pain than the other. There has been no fever. The pain is mild. Associated symptoms include congestion and coughing (productive, discolored). Pertinent negatives include no drooling, ear pain, headaches, shortness of breath, swollen glands, trouble swallowing or vomiting. Treatments tried: dayquil and nyquil. The treatment provided no relief.     Patient Active Problem List   Diagnosis Date Noted   Ganglion cyst of wrist, right 11/05/2017   No past medical history on file.    Medications: Outpatient Medications Prior to Visit  Medication Sig   amoxicillin (AMOXIL) 500 MG capsule Take 1 capsule (500 mg total) by mouth 3 (three) times daily.   bacitracin ointment Apply 1 application topically 2 (two) times daily.   No facility-administered medications prior  to visit.    Review of Systems  Constitutional:  Negative for appetite change, chills, fatigue and fever.  HENT:  Positive for congestion, postnasal drip, sinus pressure and sore throat. Negative for drooling, ear pain, rhinorrhea, sneezing and trouble swallowing.   Respiratory:  Positive for cough (productive, discolored). Negative for chest tightness, shortness of breath and wheezing.   Cardiovascular: Negative.   Gastrointestinal:  Negative for nausea and vomiting.   Neurological:  Negative for dizziness and headaches.   Last CBC Lab Results  Component Value Date   WBC 4.7 11/05/2017   HGB 14.5 11/05/2017   HCT 45.4 11/05/2017   MCV 91 11/05/2017   MCH 29.0 11/05/2017   RDW 14.3 11/05/2017   PLT 235 11/05/2017   Last metabolic panel Lab Results  Component Value Date   GLUCOSE 92 11/05/2017   NA 142 11/05/2017   K 4.7 11/05/2017   CL 101 11/05/2017   CO2 24 11/05/2017   BUN 11 11/05/2017   CREATININE 1.20 11/05/2017   GFRNONAA 81 11/05/2017   GFRAA 93 11/05/2017   CALCIUM 9.8 11/05/2017   PROT 7.5 11/05/2017   ALBUMIN 4.8 11/05/2017   LABGLOB 2.7 11/05/2017   AGRATIO 1.8 11/05/2017   BILITOT 0.5 11/05/2017   ALKPHOS 75 11/05/2017   AST 22 11/05/2017   ALT 33 11/05/2017      Objective    There were no vitals taken for this visit. BP Readings from Last 3 Encounters:  12/02/20 127/87  11/27/20 (!) 142/94  08/25/18 (!) 143/85   Wt Readings from Last 3 Encounters:  11/05/17 236 lb (107 kg)  10/23/17 245 lb (111.1 kg)      Physical Exam Vitals reviewed.  Constitutional:      General: He is not in acute distress.    Appearance: Normal appearance. He is well-developed. He is not ill-appearing.  HENT:     Head: Normocephalic and atraumatic.  Eyes:     Conjunctiva/sclera: Conjunctivae normal.  Pulmonary:     Effort: Pulmonary effort is normal. No respiratory distress (able to speak in full, coherent sentences without issue).  Musculoskeletal:     Cervical back: Normal range of motion and neck supple.  Neurological:     Mental Status: He is alert.  Psychiatric:        Mood and Affect: Mood normal.        Behavior: Behavior normal.        Thought Content: Thought content normal.        Judgment: Judgment normal.       Assessment & Plan     1. Strep pharyngitis - Suspect possible strep throat, or other source of bacterial pharyngitis - Amoxicillin prescribed as below - Discussed salt water gargles and/or  chloraseptic spray as needed for throat pain - May continue tylenol as needed for pain and/or fevers - Seek in-person evaluation if symptoms fail to improve or worsen - amoxicillin (AMOXIL) 500 MG capsule; Take 1 capsule (500 mg total) by mouth 3 (three) times daily.  Dispense: 30 capsule; Refill: 0   No follow-ups on file.     I discussed the assessment and treatment plan with the patient. The patient was provided an opportunity to ask questions and all were answered. The patient agreed with the plan and demonstrated an understanding of the instructions.   The patient was advised to call back or seek an in-person evaluation if the symptoms worsen or if the condition fails to improve as anticipated.  I provided 12  minutes of face-to-face time during this encounter via MyChart Video enabled encounter.   Reine Just [facility] [phone] (phone) [phone] (fax)  [facility]

## 2021-01-23 NOTE — Patient Instructions (Signed)
Strep Throat, Adult Strep throat is an infection of the throat. It is caused by germs (bacteria). Strep throat is common during the cold months of the year. It mostly affects children who are 5-34 years old. However, people of all ages can get it at anytime of the year. When strep throat affects the tonsils, it is called tonsillitis. When it affects the back of the throat, it is called pharyngitis. This infection spreads from person to person through coughing, sneezing, or having closecontact. What are the causes? This condition is caused by the Streptococcus pyogenesgerm. What increases the risk? You are more likely to develop this condition if: You care for young children. Children are more likely to get strep throat and may spread it to others. You go to crowded places. Germs can spread easily in such places. You kiss or touch someone who has strep throat. What are the signs or symptoms? Symptoms of this condition include: Fever or chills. Redness, swelling, or pain in the tonsils or throat. Pain or trouble when swallowing. White or yellow spots on the tonsils or throat. Tender glands in the neck and under the jaw. Bad breath. Red rash all over the body. This is rare. How is this treated? This condition may be treated with: Medicines that kill germs (antibiotics). Medicines that treat pain or fever. These include: Ibuprofen or acetaminophen. Aspirin, only for patients who are over the age of 18. Throat lozenges. Throat sprays. Follow these instructions at home: Medicines  Take over-the-counter and prescription medicines only as told by your doctor. Take your antibiotic medicine as told by your doctor. Do not stop taking the antibiotic even if you start to feel better.  Eating and drinking  If you have trouble swallowing, eat soft foods until your throat feels better. Drink enough fluid to keep your pee (urine) pale yellow. To help with pain, you may have: Warm fluids, such as  soup and tea. Cold fluids, such as frozen desserts or popsicles.  General instructions Rinse your mouth (gargle) with a salt-water mixture 3-4 times a day or as needed. To make a salt-water mixture, dissolve -1 tsp (3-6 g) of salt in 1 cup (237 mL) of warm water. Rest as much as you can. Stay home from work or school until you have been taking antibiotics for 24 hours. Avoid smoking or being around people who smoke. Keep all follow-up visits as told by your doctor. This is important. How is this prevented?  Do not share food, drinking cups, or personal items. They can cause the germs to spread. Wash your hands well with soap and water. Make sure that all people in your house wash their hands well. Have family members tested if they have a fever or a sore throat. They may need an antibiotic if they have strep throat.  Contact a doctor if: You have swelling in your neck that keeps getting bigger. You get a rash, cough, or earache. You cough up a thick fluid that is green, yellow-brown, or bloody. You have pain that does not get better with medicine. Your symptoms get worse instead of getting better. You have a fever. Get help right away if: You vomit. You have a very bad headache. Your neck hurts or feels stiff. You have chest pain or are short of breath. You have drooling, very bad throat pain, or changes in your voice. Your neck is swollen, or the skin gets red and tender. Your mouth is dry, or you are peeing less than normal.   You keep feeling more tired or have trouble waking up. Your joints are red or painful. Summary Strep throat is an infection of the throat. It is caused by germs (bacteria). This infection can spread from person to person through coughing, sneezing, or having close contact. Take your medicines, including antibiotics, as told by your doctor. Do not stop taking the antibiotic even if you start to feel better. To prevent the spread of germs, wash your hands  well with soap and water. Have others do the same. Do not share food, drinking cups, or personal items. Get help right away if you have a bad headache, chest pain, shortness of breath, a stiff or painful neck, or you vomit. This information is not intended to replace advice given to you by your health care provider. Make sure you discuss any questions you have with your healthcare provider. Document Revised: 10/17/2018 Document Reviewed: 10/17/2018 Elsevier Patient Education  2022 [employer]  

## 2021-02-07 ENCOUNTER — Other Ambulatory Visit: Payer: Self-pay

## 2021-02-07 ENCOUNTER — Ambulatory Visit
Admission: RE | Admit: 2021-02-07 | Discharge: 2021-02-07 | Disposition: A | Discharge status: Home / Self Care | Payer: 59 | Source: Ambulatory Visit | Attending: Internal Medicine | Admitting: Internal Medicine

## 2021-02-07 VITALS — BP 139/84 | HR 114 | Temp 100.3°F | Resp 20 | Ht 73.0 in | Wt 245.0 lb

## 2021-02-07 DIAGNOSIS — B084 Enteroviral vesicular stomatitis with exanthem: Secondary | ICD-10-CM

## 2021-02-07 NOTE — ED Provider Notes (Signed)
Jared Colon    CSN: [id number] Arrival date & time: 02/07/21  1517      History   Chief Complaint Chief Complaint  Patient presents with   Rash    HPI Man Jared Colon is a 35 y.o. male.  Patient presents with a rash on his hands, forearms, buttocks, and feet x2 days.  He also reports a low-grade fever.  He denies earache, sore throat, cough, shortness of breath, vomiting, diarrhea, or other symptoms.  Treatment at home with hydrocortisone cream and Zyrtec; last taken yesterday.  Patient was diagnosed with strep pharyngitis on a video visit on 01/23/2021; treated with amoxicillin; He states his sore throat and other symptoms were completely resolved after treatment.  The history is provided by the patient.   History reviewed. No pertinent past medical history.  Patient Active Problem List   Diagnosis Date Noted   Ganglion cyst of wrist, right 11/05/2017    History reviewed. No pertinent surgical history.     Home Medications    Prior to Admission medications   Medication Sig Start Date End Date Taking? Authorizing Provider  amoxicillin (AMOXIL) 500 MG capsule Take 1 capsule (500 mg total) by mouth 3 (three) times daily. 01/23/21   Margaretann Loveless, PA-C    Family History Family History  Problem Relation Age of Onset   COPD Mother    Hypertension Mother    Asthma Brother     Social History Social History   Tobacco Use   Smoking status: Never   Smokeless tobacco: Never  Vaping Use   Vaping Use: Never used  Substance Use Topics   Alcohol use: Yes    Comment: One drink twice weekly   Drug use: Never     Allergies   Patient has no known allergies.   Review of Systems Review of Systems  Constitutional:  Positive for fever. Negative for chills.  HENT:  Negative for ear pain and sore throat.   Respiratory:  Negative for cough and shortness of breath.   Cardiovascular:  Negative for chest pain and palpitations.  Gastrointestinal:  Negative for  abdominal pain, diarrhea and vomiting.  Skin:  Positive for rash. Negative for color change.  All other systems reviewed and are negative.   Physical Exam Triage Vital Signs ED Triage Vitals  Enc Vitals Group     BP 02/07/21 0948 139/84     Pulse Rate 02/07/21 0948 (!) 114     Resp 02/07/21 0948 20     Temp 02/07/21 0948 100.3 F (37.9 C)     Temp Source 02/07/21 0948 Oral     SpO2 02/07/21 0948 94 %     Weight 02/07/21 0946 245 lb (111.1 kg)     Height 02/07/21 0946 6\' 1"  (1.854 m)     Head Circumference --      Peak Flow --      Pain Score 02/07/21 0945 5     Pain Loc --      Pain Edu? --      Excl. in GC? --    No data found.  Updated Vital Signs BP 139/84 (BP Location: Left Arm)   Pulse (!) 114   Temp 100.3 F (37.9 C) (Oral)   Resp 20   Ht 6\' 1"  (1.854 m)   Wt 245 lb (111.1 kg)   SpO2 94%   BMI 32.32 kg/m   Visual Acuity Right Eye Distance:   Left Eye Distance:   Bilateral Distance:  Right Eye Near:   Left Eye Near:    Bilateral Near:     Physical Exam Vitals and nursing note reviewed.  Constitutional:      General: He is not in acute distress.    Appearance: He is well-developed.  HENT:     Head: Normocephalic and atraumatic.     Right Ear: Tympanic membrane normal.     Left Ear: Tympanic membrane normal.     Nose: Nose normal.     Mouth/Throat:     Mouth: Mucous membranes are moist.     Pharynx: Oropharynx is clear.  Eyes:     Conjunctiva/sclera: Conjunctivae normal.  Cardiovascular:     Rate and Rhythm: Normal rate and regular rhythm.     Heart sounds: Normal heart sounds.  Pulmonary:     Effort: Pulmonary effort is normal. No respiratory distress.     Breath sounds: Normal breath sounds.  Abdominal:     Palpations: Abdomen is soft.     Tenderness: There is no abdominal tenderness.  Musculoskeletal:     Cervical back: Neck supple.  Skin:    General: Skin is warm and dry.     Findings: Rash present.     Comments: Red maculopapular  rash on palms, forearms, feet.  No lesions in mouth.  Neurological:     General: No focal deficit present.     Mental Status: He is alert and oriented to person, place, and time.     Gait: Gait normal.  Psychiatric:        Mood and Affect: Mood normal.        Behavior: Behavior normal.     UC Treatments / Results  Labs (all labs ordered are listed, but only abnormal results are displayed) Labs Reviewed - No data to display  EKG   Radiology No results found.  Procedures Procedures (including critical care time)  Medications Ordered in UC Medications - No data to display  Initial Impression / Assessment and Plan / UC Course  I have reviewed the triage vital signs and the nursing notes.  Pertinent labs & imaging results that were available during my care of the patient were reviewed by me and considered in my medical decision making (see chart for details).   Hand, foot, and mouth disease.  Discussed symptomatic treatment.  Education provided on hand-foot-and-mouth.  Work note provided.  Instructed patient to follow-up with his PCP or return here if his symptoms are not improving.  He agrees to plan of care.   Final Clinical Impressions(s) / UC Diagnoses   Final diagnoses:  Hand, foot and mouth disease     Discharge Instructions      Take Tylenol or ibuprofen as needed for fever or discomfort.  See the attached information on hand-foot-and-mouth disease.    Follow up with your primary care provider if your symptoms are not improving.         ED Prescriptions   None    PDMP not reviewed this encounter.   Mickie Bail, NP 02/07/21 1019

## 2021-02-07 NOTE — Discharge Instructions (Addendum)
Take Tylenol or ibuprofen as needed for fever or discomfort.  See the attached information on hand-foot-and-mouth disease.    Follow up with your primary care provider if your symptoms are not improving.

## 2021-02-07 NOTE — ED Triage Notes (Addendum)
Pt presents today with c/o of rash to bilateral forearms, buttocks and soles of feet x 2 days. He also c/o of pain in esophagus with swallowing. He also reports just finished Amoxicillin course for Upper Respiratory Infection (last dose 02/05/21).

## 2021-02-08 ENCOUNTER — Encounter (HOSPITAL_COMMUNITY): Payer: Self-pay | Admitting: Student

## 2021-02-08 ENCOUNTER — Other Ambulatory Visit: Payer: Self-pay

## 2021-02-08 ENCOUNTER — Emergency Department (HOSPITAL_COMMUNITY)
Admission: EM | Admit: 2021-02-08 | Discharge: 2021-02-08 | Disposition: A | Discharge status: Home / Self Care | Payer: 59 | Attending: Emergency Medicine | Admitting: Emergency Medicine

## 2021-02-08 DIAGNOSIS — D72829 Elevated white blood cell count, unspecified: Secondary | ICD-10-CM | POA: Diagnosis not present

## 2021-02-08 DIAGNOSIS — L509 Urticaria, unspecified: Secondary | ICD-10-CM | POA: Insufficient documentation

## 2021-02-08 DIAGNOSIS — R21 Rash and other nonspecific skin eruption: Secondary | ICD-10-CM | POA: Diagnosis present

## 2021-02-08 DIAGNOSIS — T783XXA Angioneurotic edema, initial encounter: Secondary | ICD-10-CM

## 2021-02-08 LAB — BASIC METABOLIC PANEL
Anion gap: 10 (ref 5–15)
BUN: 12 mg/dL (ref 6–20)
CO2: 24 mmol/L (ref 22–32)
Calcium: 9.3 mg/dL (ref 8.9–10.3)
Chloride: 100 mmol/L (ref 98–111)
Creatinine, Ser: 1.38 mg/dL — ABNORMAL HIGH (ref 0.61–1.24)
GFR, Estimated: >60 mL/min (ref >60)
Glucose, Bld: 110 mg/dL — ABNORMAL HIGH (ref 70–99)
Potassium: 4.5 mmol/L (ref 3.5–5.1)
Sodium: 134 mmol/L — ABNORMAL LOW (ref 135–145)

## 2021-02-08 LAB — CBC
HCT: 49.4 % (ref 39.0–52.0)
Hemoglobin: 16.4 g/dL (ref 13.0–17.0)
MCH: 28.2 pg (ref 26.0–34.0)
MCHC: 33.2 g/dL (ref 30.0–36.0)
MCV: 85 fL (ref 80.0–100.0)
Platelets: 238 10*3/uL (ref 150–400)
RBC: 5.81 MIL/uL (ref 4.22–5.81)
RDW: 13.6 % (ref 11.5–15.5)
WBC: 13.8 10*3/uL — ABNORMAL HIGH (ref 4.0–10.5)
nRBC: 0 % (ref 0.0–0.2)

## 2021-02-08 MED ORDER — FAMOTIDINE 20 MG PO TABS: 20.0000 mg | ORAL_TABLET | Freq: Two times a day (BID) | ORAL | 0 refills | Status: DC

## 2021-02-08 MED ORDER — DIPHENHYDRAMINE HCL 25 MG PO TABS: 50.0000 mg | ORAL_TABLET | Freq: Four times a day (QID) | ORAL | 0 refills | Status: DC | PRN

## 2021-02-08 MED ORDER — EPINEPHRINE 0.3 MG/0.3ML IJ SOAJ
0.3000 mg | Freq: Once | INTRAMUSCULAR | Status: AC
  Administered 2021-02-08: 0.3 mg via INTRAMUSCULAR
  Filled 2021-02-08: qty 0.3

## 2021-02-08 MED ORDER — DEXAMETHASONE SODIUM PHOSPHATE 10 MG/ML IJ SOLN
10.0000 mg | Freq: Once | INTRAMUSCULAR | Status: AC
  Administered 2021-02-08: 10 mg via INTRAVENOUS
  Filled 2021-02-08: qty 1

## 2021-02-08 MED ORDER — FAMOTIDINE IN NACL 20-0.9 MG/50ML-% IV SOLN
20.0000 mg | Freq: Once | INTRAVENOUS | Status: AC
  Administered 2021-02-08: 20 mg via INTRAVENOUS
  Filled 2021-02-08: qty 50

## 2021-02-08 MED ORDER — DIPHENHYDRAMINE HCL 50 MG/ML IJ SOLN
25.0000 mg | Freq: Once | INTRAMUSCULAR | Status: AC
  Administered 2021-02-08: 25 mg via INTRAVENOUS
  Filled 2021-02-08: qty 1

## 2021-02-08 MED ORDER — SODIUM CHLORIDE 0.9 % IV BOLUS
500.0000 mL | Freq: Once | INTRAVENOUS | Status: AC
Start: 2021-02-08 — End: 2021-02-08
  Administered 2021-02-08: 500 mL via INTRAVENOUS

## 2021-02-08 MED ORDER — PREDNISONE 10 MG (21) PO TBPK: ORAL_TABLET | Freq: Every day | ORAL | 0 refills | Status: DC

## 2021-02-08 MED ORDER — EPINEPHRINE 0.3 MG/0.3ML IJ SOAJ: 0.3000 mg | INTRAMUSCULAR | 0 refills | Status: DC | PRN

## 2021-02-08 NOTE — ED Triage Notes (Signed)
Presents with swelling to his hands, lips swelling and hives to his upper torso and buttocks x2 days  Pt recently completed Amoxicillin, last dose 02/05/2021 Pt denies any other new soaps, detergents or eating anything different

## 2021-02-08 NOTE — Discharge Instructions (Addendum)
You were seen in the emergency department today for a rash/allergic reaction.  Your labs show that your kidney function was mildly elevated, please have this rechecked by primary care provider.  Do not take amoxicillin again.  We are sending you home with Benadryl to take every 6 hours and Pepcid to take every 12 hours.  These are both antihistamines to help with the rash/itching/swelling.  We are also sending you home with a prednisone steroid pack to take for the next 6 days.  Additionally we are sending home with an EpiPen, please use this if you feel that your throat is closing or you have trouble breathing, please see attached handout, if you use your EpiPen you will need to return to the emergency department, please call 911.  We have prescribed you new medication(s) today. Discuss the medications prescribed today with your pharmacist as they can have adverse effects and interactions with your other medicines including over the counter and prescribed medications. Seek medical evaluation if you start to experience new or abnormal symptoms after taking one of these medicines, seek care immediately if you start to experience difficulty breathing, feeling of your throat closing, facial swelling, or rash as these could be indications of a more serious allergic reaction  We would like you to follow-up very closely with an allergist-please call today or tomorrow to make closest available follow-up appointment.  Return to the emergency department for any new or worsening symptoms including but not limited to trouble breathing, sensation of throat closing, dizziness, passing out, fever, worsening swelling, new or worsening rashes, new or worsening pain, or any other concerns.

## 2021-02-08 NOTE — ED Notes (Signed)
Pt alert asking for urinal  Given  No distress

## 2021-02-08 NOTE — ED Notes (Signed)
Pt not hVING ny difficulty breathing vitals good  No distress

## 2021-02-08 NOTE — ED Notes (Signed)
No complaints no distress

## 2021-02-08 NOTE — ED Provider Notes (Signed)
7:31 PM signout from Petrucelli PA-C at shift change.  Patient here with angioedema and allergic reaction type symptoms.  Symptoms started last night.  He was given an EpiPen and required observation in the ED.  Patient was reassessed several times.  Overall, symptoms seem to be slowly improving.  They are not resolved.  He is not having any difficulty breathing.  No significant swelling of the tongue or posterior pharynx.  Patient is comfortable with discharge to home at this time.  He is anxious to get something to eat.  He does about 10 minutes away and is willing to call EMS with worsening and return promptly to the emergency department.  He has received steroids and antihistamines as well.  Will be discharged home with the same.  Also prescribed EpiPen.  He seems reliable to return if symptoms worsen.  BP 128/74   Pulse 90   Temp 98.6 F (37 C) (Oral)   Resp 18   SpO2 96%     Renne Crigler, PA-C 02/08/21 1932    Virgina Norfolk, DO 02/08/21 2317

## 2021-02-08 NOTE — ED Provider Notes (Signed)
I provided a substantive portion of the care of this patient.  I personally performed the entirety of the exam for this encounter.     Patient finished a course of amoxicillin for sore throat.  Day before yesterday he started getting swelling of his hands and then his mouth.  He is having a lot of itching of it and was both the hands and the feet became swollen and there was some hives on the arms.  Also developed a lot of swelling of the lips.  He reports his throat still feels kind of scratchy but no difficulty swallowing or breathing.  Patient is alert nontoxic.  No respiratory distress.  He does have large swelling of the lips diffusely.  Posterior airway is widely patent.  No erythema or exudates on the tonsils.  No stridor.  Both hands are edematous and erythematous.  There are some urticarial rash on the forearms.  Patient has been treated with Benadryl, Decadron and Pepcid.  Symptoms persist.  At this time this appears to stomach with facial swelling some perception of throat scratchiness and discomfort as well as bilateral hand swelling and urticaria.  We will proceed with EpiPen and observe.  Agree with plan of management   Arby Barrette, MD 02/08/21 [phone]

## 2021-02-08 NOTE — ED Provider Notes (Signed)
[facility] EMERGENCY DEPARTMENT Provider Note   CSN: [id number] Arrival date & time: 02/08/21  1106     History Chief Complaint  Patient presents with   Allergic Reaction    Jared Colon is a 35 y.o. male without significant past medical history who presents to the emergency department with concern for allergic reaction over the past few days.  Patient states 2 days ago, he developed some swelling to his hands as well as a rash to the upper extremities, last night his lip started to swell.  Symptoms persisted into this morning which concerned him, the swelling of his lips has not really gotten any better or worse since last night.  No specific alleviating or aggravating factors.  He did take Benadryl at 7 AM.  The only medication he is taking is amoxicillin, he finished this a few days ago for pharyngitis.  He was seen in urgent care yesterday and was thought to have hand, foot, and mouth.  He denies any new products or environments.  He denies fever, chills, shortness of breath, sensation of throat closing, nausea, vomiting, abdominal pain, or dizziness.  HPI     History reviewed. No pertinent past medical history.  Patient Active Problem List   Diagnosis Date Noted   Ganglion cyst of wrist, right 11/05/2017    History reviewed. No pertinent surgical history.     Family History  Problem Relation Age of Onset   COPD Mother    Hypertension Mother    Asthma Brother     Social History   Tobacco Use   Smoking status: Never   Smokeless tobacco: Never  Vaping Use   Vaping Use: Never used  Substance Use Topics   Alcohol use: Yes    Comment: One drink twice weekly   Drug use: Never    Home Medications Prior to Admission medications   Medication Sig Start Date End Date Taking? Authorizing Provider  amoxicillin (AMOXIL) 500 MG capsule Take 1 capsule (500 mg total) by mouth 3 (three) times daily. 01/23/21   Margaretann Loveless, PA-C    Allergies     Patient has no known allergies.  Review of Systems   Review of Systems  Constitutional:  Negative for chills and fever.  HENT:  Positive for facial swelling. Negative for voice change.   Respiratory:  Negative for shortness of breath.   Cardiovascular:  Negative for chest pain.  Gastrointestinal:  Negative for abdominal pain, nausea and vomiting.  Skin:  Positive for rash.  Neurological:  Negative for dizziness and syncope.  All other systems reviewed and are negative.  Physical Exam Updated Vital Signs BP (!) 136/93   Pulse 88   Temp 98.6 F (37 C) (Oral)   Resp 16   SpO2 100%   Physical Exam Vitals and nursing note reviewed.  Constitutional:      General: He is not in acute distress.    Appearance: He is well-developed. He is not toxic-appearing.  HENT:     Head: Normocephalic and atraumatic.     Mouth/Throat:     Comments: Angioedema of the lips.  No edema to the oropharynx.  Uvula is midline without swelling.  Patient is tolerating his own secretions without difficulty.  Airway is patent.  No stridor.  No trismus.  Eyes:     General:        Right eye: No discharge.        Left eye: No discharge.     Conjunctiva/sclera: Conjunctivae  normal.  Cardiovascular:     Rate and Rhythm: Normal rate and regular rhythm.  Pulmonary:     Effort: Pulmonary effort is normal. No respiratory distress.     Breath sounds: Normal breath sounds. No wheezing, rhonchi or rales.  Abdominal:     General: There is no distension.     Palpations: Abdomen is soft.     Tenderness: There is no abdominal tenderness. There is no guarding or rebound.  Musculoskeletal:     Cervical back: Neck supple.     Comments: Edema to the hands is noted.  Skin:    General: Skin is warm and dry.     Findings: Rash (urticarial to upper extremities/trunk. no palm/sole involvement.) present.  Neurological:     Mental Status: He is alert.     Comments: Clear speech.   Psychiatric:        Behavior: Behavior  normal.    ED Results / Procedures / Treatments   Labs (all labs ordered are listed, but only abnormal results are displayed) Labs Reviewed  CBC - Abnormal; Notable for the following components:      Result Value   WBC 13.8 (*)    All other components within normal limits  BASIC METABOLIC PANEL - Abnormal; Notable for the following components:   Sodium 134 (*)    Glucose, Bld 110 (*)    Creatinine, Ser 1.38 (*)    All other components within normal limits    EKG None  Radiology No results found.  Procedures .Critical Care  Date/Time: 02/08/2021 2:26 PM Performed by: Cherly Anderson, PA-C Authorized by: Cherly Anderson, PA-C    CRITICAL CARE Performed by: Harvie Heck   Total critical care time: 35 minutes  Critical care time was exclusive of separately billable procedures and treating other patients.  Critical care was necessary to treat or prevent imminent or life-threatening deterioration.  Critical care was time spent personally by me on the following activities: development of treatment plan with patient and/or surrogate as well as nursing, discussions with consultants, evaluation of patient's response to treatment, examination of patient, obtaining history from patient or surrogate, ordering and performing treatments and interventions, ordering and review of laboratory studies, ordering and review of radiographic studies, pulse oximetry and re-evaluation of patient's condition.  Medications Ordered in ED Medications  diphenhydrAMINE (BENADRYL) injection 25 mg (25 mg Intravenous Given 02/08/21 1211)  dexamethasone (DECADRON) injection 10 mg (10 mg Intravenous Given 02/08/21 1212)  famotidine (PEPCID) IVPB 20 mg premix (0 mg Intravenous Stopped 02/08/21 1247)  sodium chloride 0.9 % bolus 500 mL (500 mLs Intravenous New Bag/Given 02/08/21 1250)    ED Course  I have reviewed the triage vital signs and the nursing notes.  Pertinent labs & imaging  results that were available during my care of the patient were reviewed by me and considered in my medical decision making (see chart for details).    MDM Rules/Calculators/A&P                          Patient presents to the ED with complaints of allergic reaction.  Nontoxic, BP elevated- doubt HTN emergency.  On exam patient does have an urticarial rash with angioedema of the lips, benign oropharynx, also some edema of his hands.  Patient is protecting his airway, there is no stridor, no posterior oropharyngeal edema.  Given patient states that his lip swelling is the same as it was last night we will  hold off on epinephrine and trial Decadron, Pepcid, and Benadryl.  Patient aware that if he has any clinical change to alert staff immediately.  Additional history obtained:  Additional history obtained from chart review & nursing note review.   Lab Tests:  I Ordered, reviewed, and interpreted labs, which included:  CBC: Mild leukocytosis felt to be nonspecific. BMP: Mild elevation in creatinine compared to prior, however most recent labs are from 3 years ago.  ED Course:  13:15: No significant change in angioedema, patient does state that his throat might feel a bit tight, will give epinephrine continue to monitor.  Dr. Clarice Pole has evaluated the patient and is in agreement.  14:40: RE-EVAL: Resting comfortably, feels his lips may be a bit less swollen, no airway/posterior oropharyngeal concerns.   Patient care signed out to [facility] PA-C at change of shift pending reassessment and disposition- continuing to monitor after epi. If remains improving some and or has no acute worsening/concerns plan for discharge with pepcid, benadryl & steroids with allergy follow up- Findings and plan of care discussed with supervising physician Dr. Donnald Garre who has evaluated the patient, provided guidance &  is in agreement.   Portions of this note were generated with Scientist, clinical (histocompatibility and immunogenetics). Dictation  errors may occur despite best attempts at proofreading.  Final Clinical Impression(s) / ED Diagnoses Final diagnoses:  Urticaria    Rx / DC Orders ED Discharge Orders     None        Cherly Anderson, PA-C 02/08/21 1550    Arby Barrette, MD 02/15/21 1015

## 2021-02-08 NOTE — ED Notes (Signed)
Pt speaking complete sentences, airway intact, pt seen yesterday was not prescrbed anything.

## 2021-02-17 ENCOUNTER — Encounter: Payer: Self-pay | Admitting: Allergy & Immunology

## 2021-02-17 ENCOUNTER — Other Ambulatory Visit: Payer: Self-pay

## 2021-02-17 ENCOUNTER — Ambulatory Visit: Payer: 59 | Admitting: Allergy & Immunology

## 2021-02-17 VITALS — BP 112/70 | HR 80 | Temp 98.0°F | Resp 18 | Ht 73.0 in | Wt 255.4 lb

## 2021-02-17 DIAGNOSIS — T782XXD Anaphylactic shock, unspecified, subsequent encounter: Secondary | ICD-10-CM

## 2021-02-17 NOTE — Progress Notes (Signed)
NEW PATIENT  Date of Service/Encounter:  02/17/21  Consult requested by: Jared Manson, MD   Assessment:   Anaphylaxis - unknown trigger  Plan/Recommendations:   1. Anaphylaxis - unknown trigger - I am not convinced that this has anything to do with the tilapia.  - I am going to some labs to rule out serious causes of hives and swelling. - We will call you in 1 to 2 weeks with the results of the testing. - EpiPen training reviewed. - Anaphylaxis management plan provided.  - I think it would be a good idea to rule out an amoxicillin allergy in the future. - This is done with prick and intradermal testing and then a challenge. - We cannot do this for a couple of months since he recently had a reaction.  2. Return in about 8 weeks (around 04/14/2021) for penicillin testing and challenge.    This note in its entirety was forwarded to the Provider who requested this consultation.  Subjective:   Jared Colon is a 35 y.o. male presenting today for evaluation of  Chief Complaint  Patient presents with   Allergy Testing    Jared Colon has a history of the following: Patient Active Problem List   Diagnosis Date Noted   Ganglion cyst of wrist, right 11/05/2017    History obtained from: chart review and patient.  Jared Colon was referred by Jared Manson, MD.     Jared Colon is a 35 y.o. male presenting for an evaluation of an allergic reaction . Last week on June 26th, he had a reaction when he was eating tilapia. This tilapia was nothing abnormal. He used a fish batter from the grocery store. He eats this fairly frequently.   He reports that he started having issues with itching and urticaria on his bilateral arms the FOLLOWING day on Monday June 27th. He had hand swelling, feet swelling, and facial swelling. 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 He did have a "slight fever". Per the Urgent Care note, he had the rash for two days. Then the  next day his lips started swelling and he went to the ED 24 hours later. He was given steroids as well as epinephrine and antihistamines during that visit. He was monitored until around 8pm that evening. He was discharged with prednisone and has competed that.   He otherwise tolerates all of the major food allergens without adverse event. He was not bit by any insect during that time. He does have an EpiPen.   Of note, before this even occurred, he had a sore throat and completed a course of amoxicillin. He had a video visit and was diagnosed with Strep pharyngitis (no rapid Strep done). He started amoxicillin on 01/23/21 and he completed it the day or so prior to the onset of the swelling/rash. He has had amoxicillin multiple times without a problem. He had no joint pain or joint swelling with the onset of the rash/swelling. He last had amoxicillin five years ago when he went to the dentist for a procedure. He otherwise has no history of sinus infections or other infections.   He has no history of tick bites. He has never had red meat at all. The last time he had red meat was one year ago. He eats a lot of salmon and [place]. He does eat tilapia fairly frequently.   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 He denies any new exposures at work. No one else at work has these issue. He has three kids, ages 76, 1 and 6 months.   Otherwise, there is no history of other atopic diseases, including asthma, food allergies, drug allergies, environmental allergies, stinging insect allergies, eczema, or contact dermatitis. There is no significant infectious history. Vaccinations are up to date.    Past Medical History: Patient Active Problem List   Diagnosis Date Noted   Ganglion cyst of wrist, right 11/05/2017    Medication List:  Allergies as of 02/17/2021       Reactions   Amoxicillin Hives, Swelling, Rash, Other (See Comments)   Swelling- hands and bottoms of  feet; welts and hives on torso and buttocks (Finished a course of Amoxicillin about a week ago and landed in the ED with these symptoms)        Medication List        Accurate as of February 17, 2021 12:41 PM. If you have any questions, ask your nurse or doctor.          STOP taking these medications    predniSONE 10 MG (21) Tbpk tablet Commonly known as: STERAPRED UNI-PAK 21 TAB Stopped by: Jared Spruce, MD       TAKE these medications    acetaminophen 325 MG tablet Commonly known as: TYLENOL Take 325-650 mg by mouth every 6 (six) hours as needed for mild pain (or headaches- when not taking Advil or Motrin).   diphenhydrAMINE 25 MG tablet Commonly known as: BENADRYL Take 2 tablets (50 mg total) by mouth every 6 (six) hours as needed. What changed: Another medication with the same name was removed. Continue taking this medication, and follow the directions you see here. Changed by: Jared Spruce, MD   EPINEPHrine 0.3 mg/0.3 mL Soaj injection Commonly known as: EPI-PEN Inject 0.3 mg into the muscle as needed for anaphylaxis.   famotidine 20 MG tablet Commonly known as: PEPCID Take 1 tablet (20 mg total) by mouth 2 (two) times daily.   ibuprofen 200 MG tablet Commonly known as: ADVIL Take 200-400 mg by mouth every 6 (six) hours as needed for mild pain (or headaches- when not taking Tylenol).        Birth History: non-contributory  Developmental History: non-contributory  Past Surgical History: History reviewed. No pertinent surgical history.   Family History: Family History  Problem Relation Age of Onset   COPD Mother    Hypertension Mother    Asthma Brother      Social History: Jared Colon lives at home with his wife and 3 kids.  He works as a Press photographer at [employer].  They live in a house that is 34 years old.  There is wood in the main living areas and carpeting in the bedroom.  There are no animals inside or outside of the home.   There are dust mite covers on the bed, but not the pillows.  There is no tobacco exposure.  Prior to this, he worked as a Hospital doctor for [employer].    Review of Systems  Constitutional: Negative.  Negative for fever, malaise/fatigue and weight loss.  HENT: Negative.  Negative for congestion, ear discharge and ear pain.   Eyes:  Negative for pain, discharge and redness.  Respiratory:  Negative for cough, sputum production, shortness of breath and wheezing.   Cardiovascular: Negative.  Negative for chest pain and palpitations.  Gastrointestinal:  Negative for abdominal pain, constipation, diarrhea, heartburn, nausea  and vomiting.  Skin:  Positive for itching and rash.       Positive for angioedema.  Neurological:  Negative for dizziness and headaches.  Endo/Heme/Allergies:  Negative for environmental allergies. Does not bruise/bleed easily.      Objective:   Blood pressure 112/70, pulse 80, temperature 98 F (36.7 C), temperature source Temporal, resp. rate 18, height 6\' 1"  (1.854 m), weight 255 lb 6.4 oz (115.8 kg), SpO2 96 %. Body mass index is 33.7 kg/m.   Physical Exam:   Physical Exam Constitutional:      Appearance: He is well-developed.  HENT:     Head: Normocephalic and atraumatic.     Right Ear: Tympanic membrane, ear canal and external ear normal. No drainage, swelling or tenderness. Tympanic membrane is not injected, scarred, erythematous, retracted or bulging.     Left Ear: Tympanic membrane, ear canal and external ear normal. No drainage, swelling or tenderness. Tympanic membrane is not injected, scarred, erythematous, retracted or bulging.     Nose: No nasal deformity, septal deviation, mucosal edema or rhinorrhea.     Right Sinus: No maxillary sinus tenderness or frontal sinus tenderness.     Left Sinus: No maxillary sinus tenderness or frontal sinus tenderness.     Mouth/Throat:     Mouth: Mucous membranes are not pale and not dry.     Pharynx: Uvula midline.  Eyes:      General:        Right eye: No discharge.        Left eye: No discharge.     Conjunctiva/sclera: Conjunctivae normal.     Right eye: Right conjunctiva is not injected. No chemosis.    Left eye: Left conjunctiva is not injected. No chemosis.    Pupils: Pupils are equal, round, and reactive to light.  Cardiovascular:     Rate and Rhythm: Normal rate and regular rhythm.     Heart sounds: Normal heart sounds.  Pulmonary:     Effort: Pulmonary effort is normal. No tachypnea, accessory muscle usage or respiratory distress.     Breath sounds: Normal breath sounds. No wheezing, rhonchi or rales.  Chest:     Chest wall: No tenderness.  Abdominal:     Tenderness: There is no abdominal tenderness. There is no guarding or rebound.  Lymphadenopathy:     Head:     Right side of head: No submandibular, tonsillar or occipital adenopathy.     Left side of head: No submandibular, tonsillar or occipital adenopathy.     Cervical: No cervical adenopathy.  Skin:    Coloration: Skin is not pale.     Findings: No abrasion, erythema, petechiae or rash. Rash is not papular, urticarial or vesicular.  Neurological:     Mental Status: He is alert.     Diagnostic studies: labs sent instead          , MD [facility]

## 2021-02-17 NOTE — Patient Instructions (Addendum)
1. Anaphylaxis - unknown trigger - I am not convinced that this has anything to do with the tilapia.  - I am going to some labs to rule out serious causes of hives and swelling. - We will call you in 1 to 2 weeks with the results of the testing. - EpiPen training reviewed. - Anaphylaxis management plan provided.  - I think it would be a good idea to rule out an amoxicillin allergy in the future. - This is done with prick and intradermal testing and then a challenge. - We cannot do this for a couple of months since he recently had a reaction.  2. Return in about 8 weeks (around 04/14/2021) for penicillin testing and challenge.    Please inform us of any Emergency Department visits, hospitalizations, or changes in symptoms. Call us before going to the ED for breathing or allergy symptoms since we might be able to fit you in for a sick visit. Feel free to contact us anytime with any questions, problems, or concerns.  It was a pleasure to meet you today!  Websites that have reliable patient information: 1. American Academy of Asthma, Allergy, and Immunology: www.aaaai.org 2. Food Allergy Research and Education (FARE): foodallergy.org 3. Mothers of Asthmatics: http://www.asthmacommunitynetwork.org 4. American College of Allergy, Asthma, and Immunology: www.acaai.org   COVID-19 Vaccine Information can be found at: PodExchange.nl For questions related to vaccine distribution or appointments, please email [email or web address] or call [phone].   We realize that you might be concerned about having an allergic reaction to the COVID19 vaccines. To help with that concern, WE ARE OFFERING THE COVID19 VACCINES IN OUR OFFICE! Ask the front desk for dates!     "Like" [place] on Facebook and Instagram for our latest updates!      A healthy democracy works best when [employer] participate! Make sure you are registered to vote! If you have  moved or changed any of your contact information, you will need to get this updated before voting!  In some cases, you MAY be able to register to vote online: [email or web address]

## 2021-02-19 ENCOUNTER — Encounter: Payer: Self-pay | Admitting: Allergy & Immunology

## 2021-03-01 LAB — ALPHA-GAL PANEL
Allergen Lamb IgE: <0.1 kU/L
Beef IgE: <0.1 kU/L
IgE (Immunoglobulin E), Serum: 40 IU/mL (ref 6–495)
O215-IgE Alpha-Gal: <0.1 kU/L
Pork IgE: <0.1 kU/L

## 2021-03-01 LAB — CBC WITH DIFFERENTIAL
Basophils Absolute: 0 10*3/uL (ref 0.0–0.2)
Basos: 1 %
EOS (ABSOLUTE): 0.3 10*3/uL (ref 0.0–0.4)
Eos: 4 %
Hematocrit: 43.7 % (ref 37.5–51.0)
Hemoglobin: 14.8 g/dL (ref 13.0–17.7)
Immature Grans (Abs): 0 10*3/uL (ref 0.0–0.1)
Immature Granulocytes: 1 %
Lymphocytes Absolute: 2.6 10*3/uL (ref 0.7–3.1)
Lymphs: 39 %
MCH: 28.6 pg (ref 26.6–33.0)
MCHC: 33.9 g/dL (ref 31.5–35.7)
MCV: 84 fL (ref 79–97)
Monocytes Absolute: 0.4 10*3/uL (ref 0.1–0.9)
Monocytes: 6 %
Neutrophils Absolute: 3.3 10*3/uL (ref 1.4–7.0)
Neutrophils: 49 %
RBC: 5.18 x10E6/uL (ref 4.14–5.80)
RDW: 13.5 % (ref 11.6–15.4)
WBC: 6.6 10*3/uL (ref 3.4–10.8)

## 2021-03-01 LAB — SEDIMENTATION RATE: Sed Rate: 16 mm/hr — ABNORMAL HIGH (ref 0–15)

## 2021-03-01 LAB — CMP14+EGFR
ALT: 48 IU/L — ABNORMAL HIGH (ref 0–44)
AST: 25 IU/L (ref 0–40)
Albumin/Globulin Ratio: 2 (ref 1.2–2.2)
Albumin: 4.7 g/dL (ref 4.0–5.0)
Alkaline Phosphatase: 89 IU/L (ref 44–121)
BUN/Creatinine Ratio: 10 (ref 9–20)
BUN: 13 mg/dL (ref 6–20)
Bilirubin Total: <0.2 mg/dL (ref 0.0–1.2)
CO2: 26 mmol/L (ref 20–29)
Calcium: 9.8 mg/dL (ref 8.7–10.2)
Chloride: 99 mmol/L (ref 96–106)
Creatinine, Ser: 1.33 mg/dL — ABNORMAL HIGH (ref 0.76–1.27)
Globulin, Total: 2.4 g/dL (ref 1.5–4.5)
Glucose: 100 mg/dL — ABNORMAL HIGH (ref 65–99)
Potassium: 4.9 mmol/L (ref 3.5–5.2)
Sodium: 138 mmol/L (ref 134–144)
Total Protein: 7.1 g/dL (ref 6.0–8.5)
eGFR: 72 mL/min/{1.73_m2} (ref >59)

## 2021-03-01 LAB — C-REACTIVE PROTEIN: CRP: 3 mg/L (ref 0–10)

## 2021-03-01 LAB — C3 AND C4
Complement C3, Serum: 152 mg/dL (ref 82–167)
Complement C4, Serum: 29 mg/dL (ref 12–38)

## 2021-03-01 LAB — ALLERGEN TILAPIA F414: Allergen Tilapia IgE: <0.1 kU/L

## 2021-03-01 LAB — CHRONIC URTICARIA: cu index: <3 (ref <10)

## 2021-03-01 LAB — TRYPTASE: Tryptase: 6.5 ug/L (ref 2.2–13.2)

## 2021-03-01 LAB — ANTINUCLEAR ANTIBODIES, IFA: ANA Titer 1: NEGATIVE

## 2021-04-07 ENCOUNTER — Encounter: Payer: Self-pay | Admitting: Allergy & Immunology

## 2021-04-21 ENCOUNTER — Ambulatory Visit (INDEPENDENT_AMBULATORY_CARE_PROVIDER_SITE_OTHER): Payer: 59 | Admitting: Allergy & Immunology

## 2021-04-21 ENCOUNTER — Other Ambulatory Visit: Payer: Self-pay

## 2021-04-21 ENCOUNTER — Encounter: Payer: Self-pay | Admitting: Allergy & Immunology

## 2021-04-21 VITALS — BP 160/100 | HR 76 | Resp 16

## 2021-04-21 DIAGNOSIS — T781XXD Other adverse food reactions, not elsewhere classified, subsequent encounter: Secondary | ICD-10-CM | POA: Diagnosis not present

## 2021-04-21 DIAGNOSIS — T782XXD Anaphylactic shock, unspecified, subsequent encounter: Secondary | ICD-10-CM

## 2021-04-21 NOTE — Patient Instructions (Signed)
Concern for penicillin allergy - You tolerated your penicillin challenge today. - This will open up the class of antibiotics in the future, including amoxicillin. - This was removed from your allergy label in your chart.   2. Follow up in one year or earlier if needed. Keep your epinephrine autoinjector in place.    Please inform us of any Emergency Department visits, hospitalizations, or changes in symptoms. Call us before going to the ED for breathing or allergy symptoms since we might be able to fit you in for a sick visit. Feel free to contact us anytime with any questions, problems, or concerns.  It was a pleasure to see you again today!  Websites that have reliable patient information: 1. American Academy of Asthma, Allergy, and Immunology: www.aaaai.org 2. Food Allergy Research and Education (FARE): foodallergy.org 3. Mothers of Asthmatics: http://www.asthmacommunitynetwork.org 4. American College of Allergy, Asthma, and Immunology: www.acaai.org   COVID-19 Vaccine Information can be found at: [email or web address] For questions related to vaccine distribution or appointments, please email [email or web address] or call [phone].   We realize that you might be concerned about having an allergic reaction to the COVID19 vaccines. To help with that concern, WE ARE OFFERING THE COVID19 VACCINES IN OUR OFFICE! Ask the front desk for dates!     "Like" [place] on Facebook and Instagram for our latest updates!      A healthy democracy works best when [employer] participate! Make sure you are registered to vote! If you have moved or changed any of your contact information, you will need to get this updated before voting!  In some cases, you MAY be able to register to vote online: [email or web address]

## 2021-04-21 NOTE — Progress Notes (Signed)
FOLLOW UP  Date of Service/Encounter:  04/21/21   Assessment:   Anaphylaxis - passed penicillin challenge today  Plan/Recommendations:   Concern for penicillin allergy - You tolerated your penicillin challenge today. - This will open up the class of antibiotics in the future, including amoxicillin. - This was removed from your allergy label in your chart.   2. Follow up in one year or earlier if needed. Keep your epinephrine autoinjector in place.    Subjective:   Jared Colon is a 35 y.o. male presenting today for follow up of  Chief Complaint  Patient presents with   Food/Drug Challenge    Penicillin     Jared Colon has a history of the following: Patient Active Problem List   Diagnosis Date Noted   Ganglion cyst of wrist, right 11/05/2017    History obtained from: chart review and patient.  Jared Colon is a 35 y.o. male presenting for a drug challenge.  He was last seen in July 2022.  At that time, he came for an evaluation of anaphylaxis.  He had recently completed an amoxicillin course for strep throat before the onset of the symptoms, but had been off of amoxicillin for a day or 2 before the symptoms started.  We did get some lab work to look for an alpha gal allergy which was negative.  There was tilapia involved in the story as well so we got an IgE to that which was negative.  We recommended that he come back for penicillin testing and challenge, so he presents today.  In the interim, he has done well.  Unfortunately, when we are looking for the reagents to do the testing, we are lacking one of them.  Hearing the story again we decided just to go ahead with a penicillin challenge and skip the testing completely.  Otherwise, there have been no changes to his past medical history, surgical history, family history, or social history.    Review of Systems  Constitutional: Negative.  Negative for fever, malaise/fatigue and weight loss.  HENT: Negative.  Negative for  congestion, ear discharge and ear pain.   Eyes:  Negative for pain, discharge and redness.  Respiratory:  Negative for cough, sputum production, shortness of breath and wheezing.   Cardiovascular: Negative.  Negative for chest pain and palpitations.  Gastrointestinal:  Negative for abdominal pain and heartburn.  Skin: Negative.  Negative for itching and rash.  Neurological:  Negative for dizziness and headaches.  Endo/Heme/Allergies:  Negative for environmental allergies. Does not bruise/bleed easily.      Objective:   Blood pressure (!) 160/100, pulse 76, resp. rate 16, SpO2 98 %. There is no height or weight on file to calculate BMI.   Physical Exam: deferred since this was a challenge appointment only    Diagnostic studies:    Allergy Studies:     Oral Challenge - 04/21/21 1200     Challenge Food/Drug Penicillin    Food/Drug provided by Office    BP 150/90    Pulse 78    Respirations 18    Time 0950    Dose 0.60mL    Time 1025    Dose 27mL    Time 1100    Dose 15mL    BP 140/90    Pulse 76    Respirations 18            Doses were split by 30 minutes and he was monitored for 60 minutes following the challenge.   Allergy  testing results were read and interpreted by myself, documented by clinical staff.      Jared Bonds, MD  [facility]

## 2021-09-21 IMAGING — DX DG FINGER THUMB 2+V*R*
3 series · 3 of 3 positions shown · non-contrast
Comparison: None.

CLINICAL DATA: Right thumb wound, laceration while cutting a sweep
potato

EXAM:
RIGHT THUMB 2+V

[finger ap]
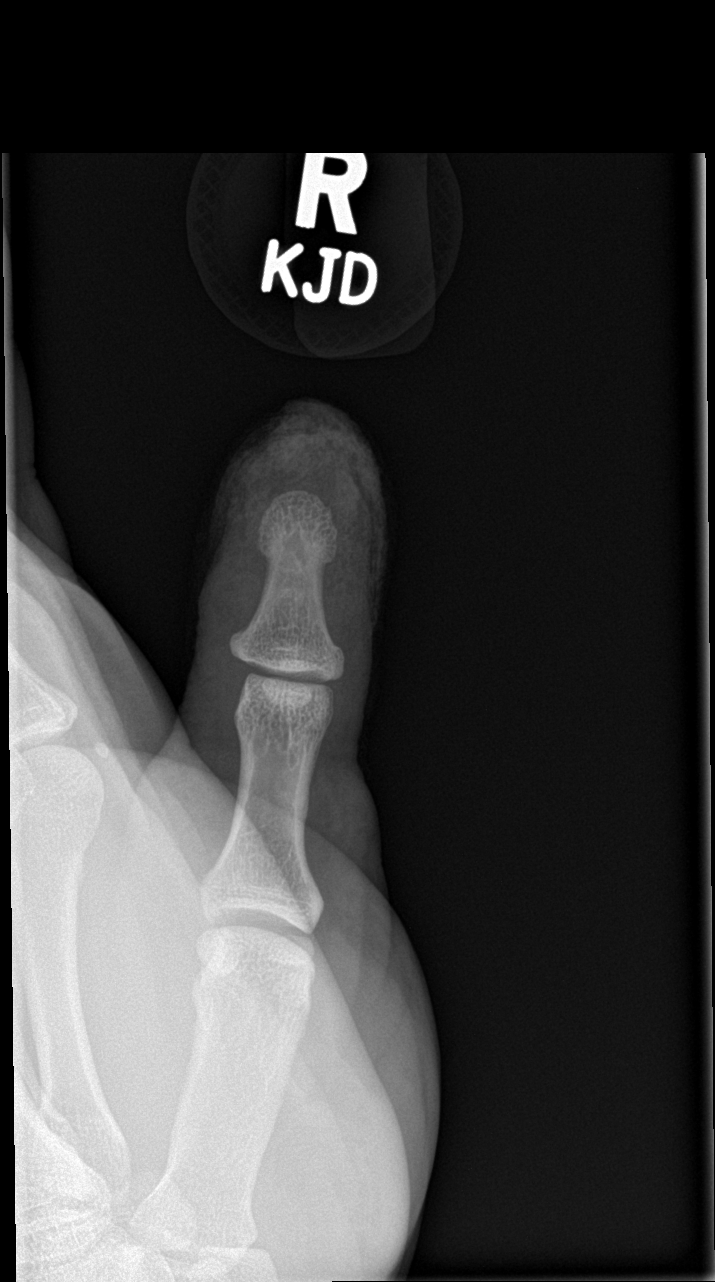

[finger obl]
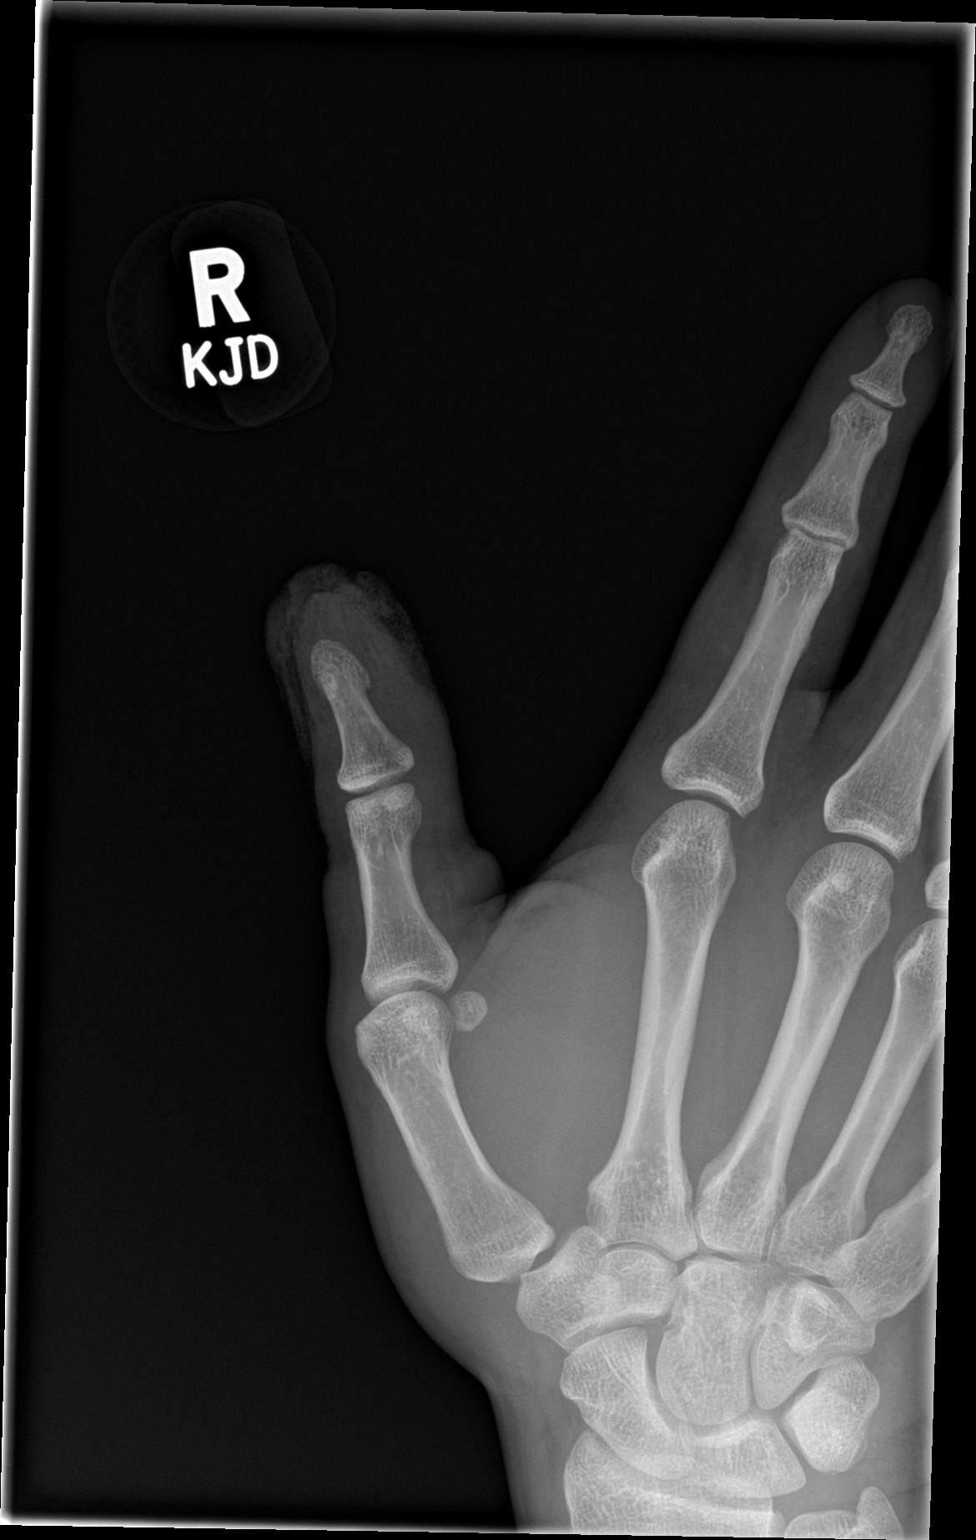

[finger lat]
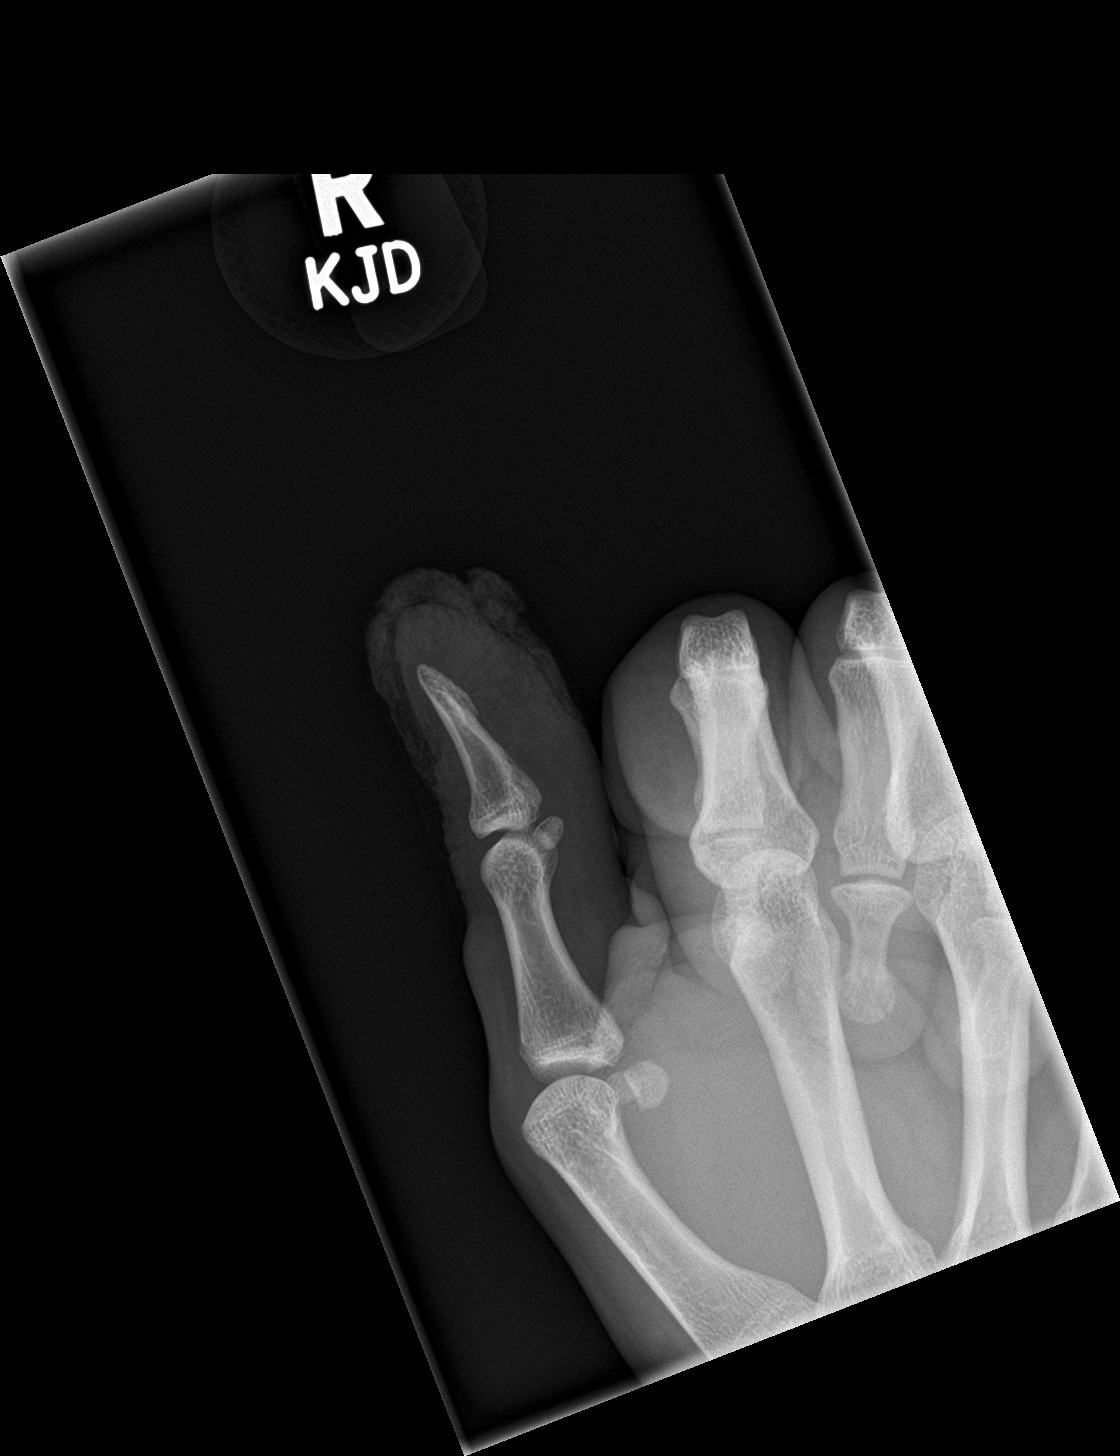

[3 of 3 positions shown; findings below may reference images not displayed]

FINDINGS: Soft tissue irregularity of the first digit compatible with reported
laceration. Overlying bandaging material is present. No acute
fracture or osseous defect is seen. No other suspicious osseous
abnormality.
IMPRESSION: 1. No acute osseous abnormality.
2. Soft tissue irregularity of the first digit compatible with
reported laceration.

## 2022-02-02 ENCOUNTER — Ambulatory Visit (INDEPENDENT_AMBULATORY_CARE_PROVIDER_SITE_OTHER): Payer: Commercial Managed Care - HMO | Admitting: Nurse Practitioner

## 2022-02-02 VITALS — BP 128/74 | HR 77 | Temp 98.6°F | Ht 73.0 in | Wt 267.0 lb

## 2022-02-02 DIAGNOSIS — E669 Obesity, unspecified: Secondary | ICD-10-CM

## 2022-02-02 DIAGNOSIS — F4321 Adjustment disorder with depressed mood: Secondary | ICD-10-CM | POA: Diagnosis not present

## 2022-02-02 DIAGNOSIS — R7989 Other specified abnormal findings of blood chemistry: Secondary | ICD-10-CM | POA: Diagnosis not present

## 2022-02-02 DIAGNOSIS — M25552 Pain in left hip: Secondary | ICD-10-CM

## 2022-02-02 DIAGNOSIS — Z131 Encounter for screening for diabetes mellitus: Secondary | ICD-10-CM | POA: Diagnosis not present

## 2022-02-02 DIAGNOSIS — Z136 Encounter for screening for cardiovascular disorders: Secondary | ICD-10-CM | POA: Diagnosis not present

## 2022-02-02 LAB — COMPREHENSIVE METABOLIC PANEL
ALT: 42 U/L (ref 0–53)
AST: 24 U/L (ref 0–37)
Albumin: 4.5 g/dL (ref 3.5–5.2)
Alkaline Phosphatase: 77 U/L (ref 39–117)
BUN: 11 mg/dL (ref 6–23)
CO2: 34 mEq/L — ABNORMAL HIGH (ref 19–32)
Calcium: 9.7 mg/dL (ref 8.4–10.5)
Chloride: 100 mEq/L (ref 96–112)
Creatinine, Ser: 1.2 mg/dL (ref 0.40–1.50)
GFR: 78.69 mL/min (ref >60.00)
Glucose, Bld: 108 mg/dL — ABNORMAL HIGH (ref 70–99)
Potassium: 4.3 mEq/L (ref 3.5–5.1)
Sodium: 140 mEq/L (ref 135–145)
Total Bilirubin: 0.3 mg/dL (ref 0.2–1.2)
Total Protein: 7.5 g/dL (ref 6.0–8.3)

## 2022-02-02 LAB — LIPID PANEL
Cholesterol: 196 mg/dL (ref 0–200)
HDL: 47.2 mg/dL (ref >39.00)
LDL Cholesterol: 118 mg/dL — ABNORMAL HIGH (ref 0–99)
NonHDL: 149.07
Total CHOL/HDL Ratio: 4
Triglycerides: 154 mg/dL — ABNORMAL HIGH (ref 0.0–149.0)
VLDL: 30.8 mg/dL (ref 0.0–40.0)

## 2022-02-02 LAB — CBC
HCT: 43.2 % (ref 39.0–52.0)
Hemoglobin: 14 g/dL (ref 13.0–17.0)
MCHC: 32.4 g/dL (ref 30.0–36.0)
MCV: 88.2 fl (ref 78.0–100.0)
Platelets: 240 10*3/uL (ref 150.0–400.0)
RBC: 4.89 Mil/uL (ref 4.22–5.81)
RDW: 13.7 % (ref 11.5–15.5)
WBC: 4.7 10*3/uL (ref 4.0–10.5)

## 2022-02-02 LAB — TSH: TSH: 2.28 u[IU]/mL (ref 0.35–5.50)

## 2022-02-02 LAB — HEMOGLOBIN A1C: Hgb A1c MFr Bld: 6.3 % (ref 4.6–6.5)

## 2022-02-02 NOTE — Assessment & Plan Note (Signed)
Creatine elevated one year ago. Labs ordered today. Will review labs and determine if any treatment is necessary at this time.

## 2022-04-12 ENCOUNTER — Encounter: Payer: Self-pay | Admitting: Nurse Practitioner

## 2022-05-10 ENCOUNTER — Ambulatory Visit (INDEPENDENT_AMBULATORY_CARE_PROVIDER_SITE_OTHER): Payer: Commercial Managed Care - HMO | Admitting: Nurse Practitioner

## 2022-05-10 ENCOUNTER — Encounter: Payer: Self-pay | Admitting: Nurse Practitioner

## 2022-05-10 VITALS — BP 124/80 | HR 76 | Temp 97.8°F | Ht 73.0 in | Wt 263.4 lb

## 2022-05-10 DIAGNOSIS — R7303 Prediabetes: Secondary | ICD-10-CM | POA: Diagnosis not present

## 2022-05-10 DIAGNOSIS — R944 Abnormal results of kidney function studies: Secondary | ICD-10-CM | POA: Insufficient documentation

## 2022-05-10 DIAGNOSIS — E785 Hyperlipidemia, unspecified: Secondary | ICD-10-CM | POA: Diagnosis not present

## 2022-05-10 DIAGNOSIS — R0681 Apnea, not elsewhere classified: Secondary | ICD-10-CM | POA: Diagnosis not present

## 2022-05-10 LAB — COMPREHENSIVE METABOLIC PANEL
ALT: 36 U/L (ref 0–53)
AST: 22 U/L (ref 0–37)
Albumin: 4.7 g/dL (ref 3.5–5.2)
Alkaline Phosphatase: 78 U/L (ref 39–117)
BUN: 13 mg/dL (ref 6–23)
CO2: 33 mEq/L — ABNORMAL HIGH (ref 19–32)
Calcium: 10.2 mg/dL (ref 8.4–10.5)
Chloride: 101 mEq/L (ref 96–112)
Creatinine, Ser: 1.19 mg/dL (ref 0.40–1.50)
GFR: 79.34 mL/min (ref >60.00)
Glucose, Bld: 81 mg/dL (ref 70–99)
Potassium: 4.5 mEq/L (ref 3.5–5.1)
Sodium: 139 mEq/L (ref 135–145)
Total Bilirubin: 0.5 mg/dL (ref 0.2–1.2)
Total Protein: 7.8 g/dL (ref 6.0–8.3)

## 2022-05-10 LAB — MICROALBUMIN / CREATININE URINE RATIO
Creatinine,U: 245.1 mg/dL
Microalb Creat Ratio: 0.5 mg/g (ref 0.0–30.0)
Microalb, Ur: 1.3 mg/dL (ref 0.0–1.9)

## 2022-05-10 NOTE — Assessment & Plan Note (Signed)
Chronic, mild.  We will recheck metabolic panel to monitor for stability as well as check urine for any albuminuria.  Further recommendations may be made based upon these results.

## 2022-05-10 NOTE — Progress Notes (Addendum)
Established Patient Office Visit  Subjective   Patient ID: Jared Colon, male    DOB: 05/29/1986  Age: 36 y.o. MRN: [id number]  Chief Complaint  Patient presents with   Snoring    Patient arrives today for follow-up of lab work.  Witnessed apneic episodes: Patient reports wife has told him she is witnessed him have apneic episodes at night.  He reports that he does snore, this is been going on for about 3 to 4 months, he does feel tired during the day and often will take naps due to this.  Additionally he has obesity with BMI of 34.  Last 2 blood pressures in our system have been at goal.  He is not currently on antihypertensive medication.  He has had a history of elevated blood pressure in the past.  Hyperlipidemia/prediabetes/decreased GFR: Last office visit was his first appoint with myself.  At that point blood work was taken which showed hyperlipidemia with LDL of 118, A1c of 6.3, GFR of 78.69 with creatinine of 1.20.  He reports since last time I saw him he and his wife have both reduce their salt intake sugar intake, additionally he has joined a gym and plans on increasing his physical activity.    Review of Systems  Eyes:  Negative for blurred vision.  Respiratory:  Negative for shortness of breath.   Cardiovascular:  Negative for chest pain.  Neurological:  Negative for headaches.      Objective:     BP 124/80   Pulse 76   Temp 97.8 F (36.6 C) (Oral)   Ht 6\' 1"  (1.854 m)   Wt 263 lb 6 oz (119.5 kg)   BMI 34.75 kg/m  BP Readings from Last 3 Encounters:  05/10/22 124/80  02/02/22 128/74  04/21/21 (!) 160/100   Wt Readings from Last 3 Encounters:  05/10/22 263 lb 6 oz (119.5 kg)  02/02/22 267 lb (121.1 kg)  02/17/21 255 lb 6.4 oz (115.8 kg)      Physical Exam Vitals reviewed.  Constitutional:      Appearance: Normal appearance.  HENT:     Head: Normocephalic and atraumatic.  Cardiovascular:     Rate and Rhythm: Normal rate and regular rhythm.   Pulmonary:     Effort: Pulmonary effort is normal.     Breath sounds: Normal breath sounds.  Musculoskeletal:     Cervical back: Neck supple.  Skin:    General: Skin is warm and dry.  Neurological:     Mental Status: He is alert and oriented to person, place, and time.  Psychiatric:        Mood and Affect: Mood normal.        Behavior: Behavior normal.        Thought Content: Thought content normal.        Judgment: Judgment normal.      No results found for any visits on 05/10/22.    The ASCVD Risk score (Arnett DK, et al., 2019) failed to calculate for the following reasons:   The 2019 ASCVD risk score is only valid for ages 62 to 1    Assessment & Plan:   Problem List Items Addressed This Visit       Other   Hyperlipidemia - Primary    Chronic, for now recommend lifestyle modification.  Patient is already made changes and I encouraged him to continue doing so.  He was encouraged to continue following a heart healthy diet as well as to increase his  physical activity to 150 minutes of exercise per week.      Relevant Orders   Comprehensive metabolic panel   Microalbumin / creatinine urine ratio   Prediabetes    Chronic, for now recommend lifestyle modification.  Patient hss already made changes and I encouraged him to continue doing so.  He was encouraged to continue following a heart healthy diet, as well as focus on reduction of processed carbohydrates and starches, and to increase his physical activity to 150 minutes of exercise per week.      Relevant Orders   Comprehensive metabolic panel   Microalbumin / creatinine urine ratio   Decreased GFR    Chronic, mild.  We will recheck metabolic panel to monitor for stability as well as check urine for any albuminuria.  Further recommendations may be made based upon these results.      Relevant Orders   Comprehensive metabolic panel   Microalbumin / creatinine urine ratio   Witnessed apneic spells    Based on  history, appears likely that he may have sleep apnea.  Will refer him to neurology to determine if he would be a candidate for sleep study for further evaluation.  Referral made today.      Relevant Orders   Ambulatory referral to Neurology    Return in about 3 months (around 08/09/2022) for F/U with Henny Strauch.    Ailene Ards, NP

## 2022-05-10 NOTE — Assessment & Plan Note (Signed)
Based on history, appears likely that he may have sleep apnea.  Will refer him to neurology to determine if he would be a candidate for sleep study for further evaluation.  Referral made today.

## 2022-05-10 NOTE — Assessment & Plan Note (Signed)
Chronic, for now recommend lifestyle modification.  Patient is already made changes and I encouraged him to continue doing so.  He was encouraged to continue following a heart healthy diet as well as to increase his physical activity to 150 minutes of exercise per week.

## 2022-05-10 NOTE — Assessment & Plan Note (Signed)
Chronic, for now recommend lifestyle modification.  Patient hss already made changes and I encouraged him to continue doing so.  He was encouraged to continue following a heart healthy diet, as well as focus on reduction of processed carbohydrates and starches, and to increase his physical activity to 150 minutes of exercise per week.

## 2022-05-10 NOTE — Patient Instructions (Signed)
F3 - Men workout group FIA - Women workout group

## 2022-05-11 ENCOUNTER — Ambulatory Visit: Payer: Commercial Managed Care - HMO | Admitting: Nurse Practitioner

## 2022-05-17 ENCOUNTER — Encounter: Payer: Self-pay | Admitting: Nurse Practitioner

## 2022-05-26 ENCOUNTER — Encounter: Payer: Self-pay | Admitting: Neurology

## 2022-06-12 ENCOUNTER — Encounter: Payer: Self-pay | Admitting: Neurology

## 2022-06-12 ENCOUNTER — Ambulatory Visit (INDEPENDENT_AMBULATORY_CARE_PROVIDER_SITE_OTHER): Payer: Commercial Managed Care - HMO | Admitting: Neurology

## 2022-06-12 VITALS — BP 121/74 | HR 66 | Ht 73.0 in | Wt 263.8 lb

## 2022-06-12 DIAGNOSIS — R0683 Snoring: Secondary | ICD-10-CM

## 2022-06-12 DIAGNOSIS — E669 Obesity, unspecified: Secondary | ICD-10-CM | POA: Diagnosis not present

## 2022-06-12 DIAGNOSIS — G4730 Sleep apnea, unspecified: Secondary | ICD-10-CM

## 2022-06-12 DIAGNOSIS — R635 Abnormal weight gain: Secondary | ICD-10-CM

## 2022-06-12 DIAGNOSIS — G4719 Other hypersomnia: Secondary | ICD-10-CM | POA: Diagnosis not present

## 2022-06-12 DIAGNOSIS — R0681 Apnea, not elsewhere classified: Secondary | ICD-10-CM

## 2022-06-12 NOTE — Patient Instructions (Signed)

## 2022-06-12 NOTE — Progress Notes (Signed)
Subjective:    Patient ID: Jared Colon is a 36 y.o. male.  HPI    Jared Foley, MD, PhD [facility] [address] [address] [place], [place] [address]  Dear Maralyn Sago,   I saw your patient, Jared Colon, upon your kind request in my sleep clinic today for initial consultation of his sleep disorder, in particular, concern for underlying obstructive sleep apnea.  The patient is unaccompanied today.  As you know, Jared Colon is a 36 year old male with an underlying medical history of hyperlipidemia, prediabetes, and obesity, who reports snoring and excessive daytime somnolence as well as witnessed apneas.  I reviewed your office note from 05/10/2022.  His Epworth sleepiness score is 17 out of 24, fatigue severity score is 53 out of 63.  He has had symptoms for several months or even a few years.  He is not aware of any family history of sleep apnea.  He has nocturia about 2-3 times per average night, has had occasional morning headaches, typically dull, achy, does not typically take any medication for these.  He does not drink caffeine daily.  He drinks alcohol occasionally, once or twice a week, typically 2 glasses of wine.  He goes to bed around 9 and it does take him about 30 minutes to fall asleep.  Rise time is around 6.  He lives with his wife and 3 children, ages 10, 51 and 1.  They have no pets in the household.  He does not watch TV in his bedroom.  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  He has had significant weight gain in the past few months.  His Past Medical History Is Significant For: History reviewed. No pertinent past medical history.  His Past Surgical History Is Significant For: History reviewed. No pertinent surgical history.  His Family History Is Significant For: Family History  Problem Relation Age of Onset   COPD Mother    Hypertension Mother    Asthma Brother    Sleep apnea Neg Hx     His Social  History Is Significant For: Social History   Socioeconomic History   Marital status: Married    Spouse name: Jared Colon   Number of children: 1   Years of education: 14-second year of online college   Highest education level: 12th grade  Occupational History   Occupation: student-data networking  Tobacco Use   Smoking status: Never   Smokeless tobacco: Never  Vaping Use   Vaping Use: Never used  Substance and Sexual Activity   Alcohol use: Yes    Alcohol/week: 2.0 standard drinks of alcohol    Types: 2 Glasses of wine per week    Comment: One drink twice weekly   Drug use: Never   Sexual activity: Yes  Other Topics Concern   Not on file  Social History Narrative   Originally from [place], [place] to [place] with his wife and daughter Sept 2018   Social Determinants of Health   Financial Resource Strain: Low Risk  (11/05/2017)   Overall Financial Resource Strain (CARDIA)    Difficulty of Paying Living Expenses: Not hard at all  Food Insecurity: No Food Insecurity (11/05/2017)   Hunger Vital Sign    Worried About Running Out of Food in the Last Year: Never true    Ran Out of Food in the Last Year: Never true  Transportation Needs: Unmet Transportation Needs (11/05/2017)   PRAPARE -  Hydrologist (Medical): Yes    Lack of Transportation (Non-Medical): No  Physical Activity: Insufficiently Active (11/05/2017)   Exercise Vital Sign    Days of Exercise per Week: 1 day    Minutes of Exercise per Session: 20 min  Stress: No Stress Concern Present (11/05/2017)   [place]    Feeling of Stress : Only a little  Social Connections: Unknown (11/05/2017)   Social Connection and Isolation Panel [NHANES]    Frequency of Communication with Friends and Family: More than three times a week    Frequency of Social Gatherings with Friends and Family: Once a week    Attends Religious  Services: Patient refused    Marine scientist or Organizations: Not asked    Attends Archivist Meetings: Never    Marital Status: Married    His Allergies Are:  No Known Allergies:   His Current Medications Are:  Outpatient Encounter Medications as of 06/12/2022  Medication Sig   EPINEPHrine 0.3 mg/0.3 mL IJ SOAJ injection Inject 0.3 mg into the muscle as needed for anaphylaxis.   Multiple Vitamin (MULTIVITAMIN) tablet Take 1 tablet by mouth daily.   No facility-administered encounter medications on file as of 06/12/2022.  :   Review of Systems:  Out of a complete 14 point review of systems, all are reviewed and negative with the exception of these symptoms as listed below:  Review of Systems  Neurological:        Pt here for sleep consult  Pt snores ,headaches,fatigue, Pt denies hypertension,sleep study,CPAP machine     ESS FSS:    Objective:  Neurological Exam  Physical Exam Physical Examination:   Vitals:   06/12/22 0957  BP: 121/74  Pulse: 66    General Examination: The patient is a very pleasant 36 y.o. male in no acute distress. He appears well-developed and well-nourished and well groomed.   HEENT: Normocephalic, atraumatic, pupils are equal, round and reactive to light, extraocular tracking is good without limitation to gaze excursion or nystagmus noted. Hearing is grossly intact. Face is symmetric with normal facial animation. Speech is clear with no dysarthria noted. There is no hypophonia. There is no lip, neck/head, jaw or voice tremor. Neck is supple with full range of passive and active motion. There are no carotid bruits on auscultation. Oropharynx exam reveals: No significant mouth dryness, dental hygiene, moderate crowding secondary to tonsillar size of 1-2+, larger uvula, Mallampati class II.  Neck circumference of 19-1/4 inches.  He has a slight underbite.   Chest: Clear to auscultation without wheezing, rhonchi or crackles  noted.  Heart: S1+S2+0, regular and normal without murmurs, rubs or gallops noted.   Abdomen: Soft, non-tender and non-distended.  Extremities: There is no pitting edema in the distal lower extremities bilaterally.   Skin: Warm and dry without trophic changes noted.   Musculoskeletal: exam reveals no obvious joint deformities.   Neurologically:  Mental status: The patient is awake, alert and oriented in all 4 spheres. His immediate and remote memory, attention, language skills and fund of knowledge are appropriate. There is no evidence of aphasia, agnosia, apraxia or anomia. Speech is clear with normal prosody and enunciation. Thought process is linear. Mood is normal and affect is normal.  Cranial nerves II - XII are as described above under HEENT exam.  Motor exam: Normal bulk, strength and tone is noted. There is no obvious action or resting tremor.  Fine motor skills and coordination: grossly intact.  Cerebellar testing: No dysmetria or intention tremor. There is no truncal or gait ataxia.  Sensory exam: intact to light touch in the upper and lower extremities.  Gait, station and balance: He stands easily. No veering to one side is noted. No leaning to one side is noted. Posture is age-appropriate and stance is narrow based. Gait shows normal stride length and normal pace. No problems turning are noted.   Assessment and plan:  In summary, Jared Colon is a very pleasant 36 y.o.-year old male ith an underlying medical history of hyperlipidemia, prediabetes, and obesity, whose history and physical exam are concerning for sleep disordered breathing, supporting a current working diagnosis of unspecified sleep apnea, with the main differential diagnoses of obstructive sleep apnea (OSA) versus upper airway resistance syndrome (UARS) versus central sleep apnea (CSA), or mixed sleep apnea. A laboratory attended sleep study is considered gold standard for evaluation of sleep disordered breathing and  is recommended at this time and clinically justified.   I had a long chat with the patient about my findings and the diagnosis of sleep apnea, particularly OSA, its prognosis and treatment options. We talked about medical/conservative treatments, surgical interventions and non-pharmacological approaches for symptom control. I explained, in particular, the risks and ramifications of untreated moderate to severe OSA, especially with respect to developing cardiovascular disease down the road, including congestive heart failure (CHF), difficult to treat hypertension, cardiac arrhythmias (particularly A-fib), neurovascular complications including TIA, stroke and dementia. Even type 2 diabetes has, in part, been linked to untreated OSA. Symptoms of untreated OSA may include (but may not be limited to) daytime sleepiness, nocturia (i.e. frequent nighttime urination), memory problems, mood irritability and suboptimally controlled or worsening mood disorder such as depression and/or anxiety, lack of energy, lack of motivation, physical discomfort, as well as recurrent headaches, especially morning or nocturnal headaches. We talked about the importance of maintaining a healthy lifestyle and striving for healthy weight. In addition, we talked about the importance of striving for and maintaining good sleep hygiene. I recommended the following at this time: sleep study.  I outlined the differences between a laboratory attended sleep study which is considered more comprehensive and accurate over the option of a home sleep test (HST); the latter may lead to underestimation of sleep disordered breathing in some instances and does not help with diagnosing upper airway resistance syndrome and is not accurate enough to diagnose primary central sleep apnea typically. I explained the different sleep test procedures to the patient in detail and also outlined possible surgical and non-surgical treatment options of OSA, including the  use of a pressure airway pressure (PAP) device (ie CPAP, AutoPAP/APAP or BiPAP in certain circumstances), a custom-made dental device (aka oral appliance, which would require a referral to a specialist dentist or orthodontist typically, and is generally speaking not considered a good choice for patients with full dentures or edentulous state), upper airway surgical options, such as traditional UPPP (which is not considered a first-line treatment) or the Inspire device (hypoglossal nerve stimulator, which would involve a referral for consultation with an ENT surgeon, after careful selection, following inclusion criteria). I explained the PAP treatment option to the patient in detail, as this is generally considered first-line treatment.  The patient indicated that he would be willing to try PAP therapy, if the need arises. I explained the importance of being compliant with PAP treatment, not only for insurance purposes but primarily to improve patient's symptoms symptoms, and  for the patient's long term health benefit, including to reduce His cardiovascular risks longer-term.    We will pick up our discussion about the next steps and treatment options after testing.  We will keep him posted as to the test results by phone call and/or MyChart messaging where possible.  We will plan to follow-up in sleep clinic accordingly as well.  I answered all his questions today and the patient was in agreement.   I encouraged him to call with any interim questions, concerns, problems or updates or email [place] through MyChart.  Generally speaking, sleep test authorizations may take up to 2 weeks, sometimes less, sometimes longer, the patient is encouraged to get in touch with [place] if they do not hear back from the sleep lab staff directly within the next 2 weeks.  Thank you very much for allowing me to participate in the care of this nice patient. If I can be of any further assistance to you please do not hesitate to call me at  [phone].  Sincerely,   Jared Foley, MD, PhD

## 2022-07-17 ENCOUNTER — Telehealth: Payer: Self-pay | Admitting: Neurology

## 2022-07-17 NOTE — Telephone Encounter (Signed)
[employer] pending faxed notes  

## 2022-07-18 ENCOUNTER — Telehealth: Payer: Self-pay | Admitting: Neurology

## 2022-07-18 NOTE — Telephone Encounter (Signed)
I spoke to the patient.  HST- [employer] no auth req ref # [id number] on 07/18/22   He is scheduled at [facility] for 07/14/22 at 10:15 AM.

## 2022-07-18 NOTE — Telephone Encounter (Signed)
Pt is calling. Said he wants to follow-up on sleep  study. Pt is requesting a call back.

## 2022-07-24 ENCOUNTER — Ambulatory Visit: Payer: Commercial Managed Care - HMO | Admitting: Neurology

## 2022-07-24 DIAGNOSIS — G4730 Sleep apnea, unspecified: Secondary | ICD-10-CM

## 2022-07-24 DIAGNOSIS — G4719 Other hypersomnia: Secondary | ICD-10-CM

## 2022-07-24 DIAGNOSIS — R0681 Apnea, not elsewhere classified: Secondary | ICD-10-CM

## 2022-07-24 DIAGNOSIS — G4733 Obstructive sleep apnea (adult) (pediatric): Secondary | ICD-10-CM

## 2022-07-24 DIAGNOSIS — R635 Abnormal weight gain: Secondary | ICD-10-CM

## 2022-07-24 DIAGNOSIS — E669 Obesity, unspecified: Secondary | ICD-10-CM

## 2022-07-24 DIAGNOSIS — R0683 Snoring: Secondary | ICD-10-CM

## 2022-07-26 NOTE — Progress Notes (Unsigned)
   [facility]  HOME SLEEP TEST (Watch PAT) REPORT  STUDY DATE: 07/24/22  DOB: 1986/01/26  MRN: [id number]  ORDERING CLINICIAN: Huston Foley, MD, PhD   REFERRING CLINICIAN: Elenore Paddy, NP   CLINICAL INFORMATION/HISTORY: 36 year old male with an underlying medical history of hyperlipidemia, prediabetes, and obesity, who reports snoring and excessive daytime somnolence as well as witnessed apneas.   Epworth sleepiness score: 17/24.  BMI: 34.8 kg/m  FINDINGS:   Sleep Summary:   Total Recording Time (hours, min): 8 hours, 30 min  Total Sleep Time (hours, min):  7 hours, 28 min  Percent REM (%):    24.9%   Respiratory Indices:   Calculated pAHI (per hour):  18.6/hour         REM pAHI:    8.6/hour       NREM pAHI: 20.7/hour  Central pAHI: 2.5/hour  Oxygen Saturation Statistics:    Oxygen Saturation (%) Mean: 95%   Minimum oxygen saturation (%):                 89%   O2 Saturation Range (%): 89 - 99%    O2 Saturation (minutes) <=88%: 0 min  Pulse Rate Statistics:   Pulse Mean (bpm):    61/min    Pulse Range (45 - 101/min)   IMPRESSION: OSA (obstructive sleep apnea)   RECOMMENDATION:  ***   INTERPRETING PHYSICIAN:   Huston Foley, MD, PhD Medical Director, [facility]) Diplomat, ABPN (Neurology and Sleep)   [facility] [address], [address] [place], [place] [address] [phone]

## 2022-07-30 NOTE — Procedures (Signed)
   [facility]  HOME SLEEP TEST (Watch PAT) REPORT  STUDY DATE: 07/24/22  DOB: 10/22/1985  MRN: [id number]  ORDERING CLINICIAN: Huston Foley, MD, PhD   REFERRING CLINICIAN: Elenore Paddy, NP   CLINICAL INFORMATION/HISTORY: 36 year old male with an underlying medical history of hyperlipidemia, prediabetes, and obesity, who reports snoring and excessive daytime somnolence as well as witnessed apneas.   Epworth sleepiness score: 17/24.  BMI: 34.8 kg/m  FINDINGS:   Sleep Summary:   Total Recording Time (hours, min): 8 hours, 30 min  Total Sleep Time (hours, min):  7 hours, 28 min  Percent REM (%):    24.9%   Respiratory Indices:   Calculated pAHI (per hour):  18.6/hour         REM pAHI:    8.6/hour       NREM pAHI: 20.7/hour  Central pAHI: 2.5/hour  Oxygen Saturation Statistics:    Oxygen Saturation (%) Mean: 95%   Minimum oxygen saturation (%):                 89%   O2 Saturation Range (%): 89 - 99%    O2 Saturation (minutes) <=88%: 0 min  Pulse Rate Statistics:   Pulse Mean (bpm):    61/min    Pulse Range (45 - 101/min)   IMPRESSION: OSA (obstructive sleep apnea)   RECOMMENDATION:  This home sleep test demonstrates moderate obstructive sleep apnea - by number of events - with a total AHI of 18.6/hour and O2 nadir of 89%.  Mild to moderate snoring was detected, at times intermittent. Treatment with a positive airway pressure (PAP) device is recommended. The patient will be advised to proceed with an autoPAP titration/trial at home for now. A full night titration study may be considered to optimize treatment settings, monitor proper oxygen saturations and aid with improvement of tolerance and adherence, if needed down the road. Alternative treatment options may include a dental device through dentistry or orthodontics in selected patients or Inspire (hypoglossal nerve stimulator) in carefully selected patients (meeting inclusion criteria).   Concomitant weight loss is recommended (where clinically appropriate). Please note that untreated obstructive sleep apnea may carry additional perioperative morbidity. Patients with significant obstructive sleep apnea should receive perioperative PAP therapy and the surgeons and particularly the anesthesiologist should be informed of the diagnosis and the severity of the sleep disordered breathing. The patient should be cautioned not to drive, work at heights, or operate dangerous or heavy equipment when tired or sleepy. Review and reiteration of good sleep hygiene measures should be pursued with any patient. Other causes of the patient's symptoms, including circadian rhythm disturbances, an underlying mood disorder, medication effect and/or an underlying medical problem cannot be ruled out based on this test. Clinical correlation is recommended.  The patient and his referring provider will be notified of the test results. The patient will be seen in follow up in sleep clinic at [facility].  I certify that I have reviewed the raw data recording prior to the issuance of this report in accordance with the standards of the American Academy of Sleep Medicine (AASM).    INTERPRETING PHYSICIAN:   Huston Foley, MD, PhD Medical Director, [facility]) Diplomat, ABPN (Neurology and Sleep)   [facility] [address], [address] [place], [place] [address] [phone]

## 2022-07-30 NOTE — Addendum Note (Signed)
Addended by: Huston Foley on: 07/30/2022 05:13 PM   Modules accepted: Orders

## 2022-07-31 ENCOUNTER — Telehealth: Payer: Self-pay | Admitting: *Deleted

## 2022-07-31 NOTE — Telephone Encounter (Signed)
-----   Message from Huston Foley, MD sent at 07/30/2022  5:13 PM EST ----- Patient referred by PCP, seen by me on 06/12/2022, patient had a HST on 07/24/2022.    Please call and notify the patient that the recent home sleep test showed obstructive sleep apnea in the moderate range. I recommend treatment in the form of autoPAP, which means, that we don't have to bring him in for a sleep study with CPAP, but will let him start using a so called autoPAP machine at home, which is a CPAP-like machine with self-adjusting pressures. We will send the order to a local DME company (of his choice, or as per insurance requirement). The DME representative will fit him with a mask, educate him on how to use the machine, how to put the mask on, etc. I have placed an order in the chart. Please send the order, talk to patient, send report to referring MD. We will need a FU in sleep clinic for 10 weeks post-PAP set up, please arrange that with me or one of our NPs. Also reinforce the need for compliance with treatment. Thanks,   Huston Foley, MD, PhD [facility])

## 2022-07-31 NOTE — Telephone Encounter (Signed)
thanks

## 2022-07-31 NOTE — Telephone Encounter (Signed)
ok 

## 2022-07-31 NOTE — Telephone Encounter (Signed)
Pt shows to have a [employer] Bad Debt balance, phone rep reached out to billing asking that once they have spoken with pt and secured a payment to let phone rep know so pt can be called to be scheduled.  This is FYI for POD 1

## 2022-07-31 NOTE — Telephone Encounter (Signed)
Pt called back. Scheduled initial CPAP appointment for 3/26 @ 1:45 with Dr. Frances Furbish.

## 2022-07-31 NOTE — Telephone Encounter (Signed)
I spoke with the patient we discussed his sleep study results.  Patient aware he is home sleep test showed moderate OSA and Dr. Teofilo Pod recommendation is for him to be treated with AutoPap therapy.  We discussed the difference between AutoPap and CPAP.  Patient is amenable to proceeding with AutoPap.  We discussed the insurance compliance requirements which includes using the machine at least 4 hours at night and also being seen in our office between 30 and 90 days after set up.  Patient did not have a preference for DME company.  He will watch for a call from [facility] to discuss the next steps and get him scheduled for set up appointment.  He is also aware he will receive a separate call from our office within the next few days to go ahead and schedule his follow-up appointment for around early March.  His questions were answered and he verbalized appreciation for the call.  [facility] contact info sent to pt via mychart. Autopap referral faxed to [facility]. Received a receipt of confirmation. Sleep study result sent to referring provider.   Please schedule patient for initial autopap f/u appt approx 80 days from today's date.

## 2022-07-31 NOTE — Telephone Encounter (Signed)
It was confirmed with [employer] billing dept that pt does not have a bad debt balance.  Phone rep called pt to schedule, after checking DPR a vm was left asking pt to call and schedule his initial autopap f/u.

## 2022-07-31 NOTE — Telephone Encounter (Signed)
Thanks

## 2022-10-20 ENCOUNTER — Encounter: Payer: Self-pay | Admitting: Nurse Practitioner

## 2022-10-22 ENCOUNTER — Other Ambulatory Visit: Payer: Self-pay | Admitting: *Deleted

## 2022-10-22 MED ORDER — EPINEPHRINE 0.3 MG/0.3ML IJ SOAJ: 0.3000 mg | INTRAMUSCULAR | 0 refills | Status: DC | PRN

## 2022-11-06 ENCOUNTER — Ambulatory Visit: Payer: Commercial Managed Care - HMO | Admitting: Neurology

## 2022-11-06 ENCOUNTER — Encounter: Payer: Self-pay | Admitting: Neurology

## 2022-11-06 VITALS — BP 139/79 | HR 65 | Ht 73.0 in | Wt 262.0 lb

## 2022-11-06 DIAGNOSIS — G4733 Obstructive sleep apnea (adult) (pediatric): Secondary | ICD-10-CM | POA: Diagnosis not present

## 2022-11-06 NOTE — Patient Instructions (Signed)
It was nice to see you again today. I am glad to hear, things are going well with your autoPAP therapy. You have adjusted well to treatment with your new machine, and while you are compliant with it, you are not quite there yet as far as the insurance-mandated compliance percentage.   Please talk to your DME provider about getting replacement supplies on a regular basis. Please be sure to change your filter every month, your mask about every 3 months, hose about every 6 months, humidifier chamber about yearly. Some restrictions are imposed by your insurance carrier with regard to how frequently you can get certain supplies.  Your DME company can provide further details if necessary.   Please continue using your autoPAP regularly. While your insurance requires that you use PAP at least 4 hours each night on 70% of the nights, I recommend, that you not skip any nights and use it throughout the night if you can. Getting used to PAP and staying with the treatment long term does take time and patience and discipline. Untreated obstructive sleep apnea when it is moderate to severe can have an adverse impact on cardiovascular health and raise her risk for heart disease, arrhythmias, hypertension, congestive heart failure, stroke and diabetes. Untreated obstructive sleep apnea causes sleep disruption, nonrestorative sleep, and sleep deprivation. This can have an impact on your day to day functioning and cause daytime sleepiness and impairment of cognitive function, memory loss, mood disturbance, and problems focussing. Using PAP regularly can improve these symptoms.  Please follow-up to see one of our nurse practitioners in about 6 months, we can offer you a virtual visit as well.

## 2022-11-06 NOTE — Progress Notes (Signed)
Subjective:    Patient ID: Jared Colon is a 37 y.o. male.  HPI    Interim history:    Jared Colon is a 37 year old male with an underlying medical history of hyperlipidemia, prediabetes, and obesity, who presents for follow-up consultation of his obstructive sleep apnea after interim testing and starting home AutoPap therapy.  The patient is unaccompanied today.  I first met him at the request of his primary care nurse practitioner on 06/12/2022, at which time he reported snoring and excessive daytime somnolence as well as witnessed apneas.  He was advised to proceed with a sleep study.  He had a home sleep test on 07/24/2022 which indicated moderate obstructive sleep apnea with an AHI of 18.6/h, O2 nadir 89% with mild to moderate snoring detected, at times intermittent snoring.  He was advised to proceed with home AutoPap therapy.  His set up date was 09/21/2022.  His DME company is [facility].  Today, 11/06/2022: I reviewed his AutoPap compliance data from 09/22/2022 through 10/21/2022, which is a total of 30 days, during which time he used his machine 24 days with percent use days greater than 4 hours at 63%, indicating mildly suboptimal compliance, average usage for days on treatment of 5 hours and 36 minutes, residual AHI at goal at 2/h, 95th percentile of pressure at 10.3 cm with a range of 6 to 14 cm with EPR of 3.  Leak acceptable with the 95th percentile at 13.3 L/min.  He reports doing better, he is getting over an URI and could not use his machine as consistently.  He has had some intermittent lapses in treatment in the past 2 months.  He reports still adjusting to treatment but overall feels improved, he has better sleep consolidation and less daytime somnolence.  His Epworth sleepiness score is 2 out of 24, previously was 17 out of 24.  He is very motivated to continue with treatment.  His wife also gave positive feedback and that his snoring is much less.  He uses a AirTouch fullface mask but  recently the foam rim broke off.  He needs a new mask and is going to get in touch with his DME provider.  He has not changed the filter yet.  The patient's allergies, current medications, family history, past medical history, past social history, past surgical history and problem list were reviewed and updated as appropriate.   Previously:   06/12/22: (He) reports snoring and excessive daytime somnolence as well as witnessed apneas.  I reviewed your office note from 05/10/2022.  His Epworth sleepiness score is 17 out of 24, fatigue severity score is 53 out of 63.  He has had symptoms for several months or even a few years.  He is not aware of any family history of sleep apnea.  He has nocturia about 2-3 times per average night, has had occasional morning headaches, typically dull, achy, does not typically take any medication for these.  He does not drink caffeine daily.  He drinks alcohol occasionally, once or twice a week, typically 2 glasses of wine.  He goes to bed around 9 and it does take him about 30 minutes to fall asleep.  Rise time is around 6.  He lives with his wife and 3 children, ages 42, 71 and 77.  They have no pets in the household.  He does not watch TV in his bedroom.  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  He has had significant  weight gain in the past few months.   His Past Medical History Is Significant For: History reviewed. No pertinent past medical history.  His Past Surgical History Is Significant For: Past Surgical History:  Procedure Laterality Date   NO PAST SURGERIES      His Family History Is Significant For: Family History  Problem Relation Age of Onset   COPD Mother    Hypertension Mother    Asthma Brother    Sleep apnea Neg Hx     His Social History Is Significant For: Social History   Socioeconomic History   Marital status: Married    Spouse name: Warnell Bureau   Number of children: 1   Years of education:  14-second year of online college   Highest education level: 12th grade  Occupational History   Occupation: student-data networking  Tobacco Use   Smoking status: Never   Smokeless tobacco: Never  Vaping Use   Vaping Use: Never used  Substance and Sexual Activity   Alcohol use: Yes    Alcohol/week: 2.0 standard drinks of alcohol    Types: 2 Glasses of wine per week    Comment: One drink twice weekly   Drug use: Never   Sexual activity: Yes  Other Topics Concern   Not on file  Social History Narrative   Originally from [place] to [place] with his wife and daughter Sept 2018      Right handed   Caffeine: none   Social Determinants of Health   Financial Resource Strain: Low Risk  (11/05/2017)   Overall Financial Resource Strain (CARDIA)    Difficulty of Paying Living Expenses: Not hard at all  Food Insecurity: No Food Insecurity (11/05/2017)   Hunger Vital Sign    Worried About Running Out of Food in the Last Year: Never true    Ran Out of Food in the Last Year: Never true  Transportation Needs: Unmet Transportation Needs (11/05/2017)   PRAPARE - Hydrologist (Medical): Yes    Lack of Transportation (Non-Medical): No  Physical Activity: Insufficiently Active (11/05/2017)   Exercise Vital Sign    Days of Exercise per Week: 1 day    Minutes of Exercise per Session: 20 min  Stress: No Stress Concern Present (11/05/2017)   [place]    Feeling of Stress : Only a little  Social Connections: Unknown (11/05/2017)   Social Connection and Isolation Panel [NHANES]    Frequency of Communication with Friends and Family: More than three times a week    Frequency of Social Gatherings with Friends and Family: Once a week    Attends Religious Services: Patient declined    Marine scientist or Organizations: Not asked    Attends Archivist Meetings: Never    Marital  Status: Married    His Allergies Are:  No Known Allergies:   His Current Medications Are:  Outpatient Encounter Medications as of 11/06/2022  Medication Sig   EPINEPHrine 0.3 mg/0.3 mL IJ SOAJ injection Inject 0.3 mg into the muscle as needed for anaphylaxis.   Multiple Vitamin (MULTIVITAMIN) tablet Take 1 tablet by mouth daily.   No facility-administered encounter medications on file as of 11/06/2022.  :  Review of Systems:  Out of a complete 14 point review of systems, all are reviewed and negative with the exception of these symptoms as listed below:   Review of Systems  Neurological:  Patient is here alone for initial PAP follow-up. He states it took some adjusting to begin with, but now overall things are going well. He is noticing improvement in daytime sleepiness and is sleeping better throughout the night. ESS 2.    Objective:  Neurological Exam  Physical Exam Physical Examination:   Vitals:   11/06/22 1328  BP: 139/79  Pulse: 65    General Examination: The patient is a very pleasant 37 y.o. male in no acute distress. He appears well-developed and well-nourished and very well groomed.   HEENT: Normocephalic, atraumatic, pupils are equal, round and reactive to light, extraocular tracking is well preserved. No nystagmus noted. Hearing is grossly intact. Face is symmetric with normal facial animation. Speech is clear with no dysarthria noted. There is no hypophonia. There is no lip, neck/head, jaw or voice tremor. Neck is supple with full range of passive and active motion. There are no carotid bruits on auscultation. Oropharynx exam reveals: No significant mouth dryness, adequate dental hygiene, moderate crowding.  Tongue protrudes centrally and palate elevates symmetrically.  Chest: Clear to auscultation without wheezing, rhonchi or crackles noted.   Heart: S1+S2+0, regular and normal without murmurs, rubs or gallops noted.    Abdomen: Soft, non-tender and  non-distended.   Extremities: There is no pitting edema in the distal lower extremities bilaterally.    Skin: Warm and dry without trophic changes noted.    Musculoskeletal: exam reveals no obvious joint deformities.    Neurologically:  Mental status: The patient is awake, alert and oriented in all 4 spheres. His immediate and remote memory, attention, language skills and fund of knowledge are appropriate. There is no evidence of aphasia, agnosia, apraxia or anomia. Speech is clear with normal prosody and enunciation. Thought process is linear. Mood is normal and affect is normal.  Cranial nerves II - XII are as described above under HEENT exam.  Motor exam: Normal bulk, strength and tone is noted. There is no obvious action or resting tremor.  Fine motor skills and coordination: grossly intact.  Cerebellar testing: No dysmetria or intention tremor. There is no truncal or gait ataxia.  Sensory exam: intact to light touch in the upper and lower extremities.  Gait, station and balance: He stands easily. No veering to one side is noted. No leaning to one side is noted. Posture is age-appropriate and stance is narrow based. Gait shows normal stride length and normal pace. No problems turning are noted.    Assessment and plan:  In summary, Devonta Bohren is a very pleasant 37 year old male with an underlying medical history of hyperlipidemia, prediabetes, and obesity, who presents for follow-up consultation of his obstructive sleep apnea after interim testing and starting home AutoPap therapy. He had a home sleep test on 07/24/2022 which indicated moderate obstructive sleep apnea with an AHI of 18.6/h, O2 nadir 89% with mild to moderate snoring detected, at times intermittent snoring.  He started autoPAP therapy on 09/21/2022.  His DME company is [facility].  He was very compliant in the very beginning and recently picked up his usage again.  He had an upper respiratory infection in February and had lapses in  treatment.  He has greatly benefited from treatment and is motivated to continue with it.  He is advised to be consistent with his AutoPap, he is commended for his treatment adherence.  He uses a fullface mask from [employer]	[place], [place].  He needs a replacement mask and is advised to get in touch with his DME provider.  We talked about the importance of consistency with usage and also being up-to-date with supplies and replacements.  He is advised to follow-up routinely to see one of our nurse practitioners in 6 months, we can offer a video visit at the time.  I am happy to look at his compliance in the next month, he is reminded to email [place] through [facility] [place] for this, we can remotely look at his download at the time.  He is advised to call us or email [place] on MyChart with any other interim questions or concerns.  I answered all his questions today and he was in agreement. I spent 30 minutes in total face-to-face time and in reviewing records during pre-charting, more than 50% of which was spent in counseling and coordination of care, reviewing test results, reviewing medications and treatment regimen and/or in discussing or reviewing the diagnosis of OSA, the prognosis and treatment options. Pertinent laboratory and imaging test results that were available during this visit with the patient were reviewed by me and considered in my medical decision making (see chart for details).

## 2022-12-14 ENCOUNTER — Ambulatory Visit (INDEPENDENT_AMBULATORY_CARE_PROVIDER_SITE_OTHER): Payer: Commercial Managed Care - HMO | Admitting: Nurse Practitioner

## 2022-12-14 ENCOUNTER — Ambulatory Visit (INDEPENDENT_AMBULATORY_CARE_PROVIDER_SITE_OTHER): Payer: Commercial Managed Care - HMO

## 2022-12-14 VITALS — BP 138/80 | HR 85 | Temp 98.2°F | Ht 73.0 in | Wt 263.5 lb

## 2022-12-14 DIAGNOSIS — M25521 Pain in right elbow: Secondary | ICD-10-CM

## 2022-12-14 DIAGNOSIS — R7303 Prediabetes: Secondary | ICD-10-CM | POA: Diagnosis not present

## 2022-12-14 DIAGNOSIS — R944 Abnormal results of kidney function studies: Secondary | ICD-10-CM

## 2022-12-14 LAB — COMPREHENSIVE METABOLIC PANEL
ALT: 33 U/L (ref 0–53)
AST: 22 U/L (ref 0–37)
Albumin: 4.6 g/dL (ref 3.5–5.2)
Alkaline Phosphatase: 79 U/L (ref 39–117)
BUN: 11 mg/dL (ref 6–23)
CO2: 32 mEq/L (ref 19–32)
Calcium: 9.6 mg/dL (ref 8.4–10.5)
Chloride: 102 mEq/L (ref 96–112)
Creatinine, Ser: 1.23 mg/dL (ref 0.40–1.50)
GFR: 75.94 mL/min (ref >60.00)
Glucose, Bld: 107 mg/dL — ABNORMAL HIGH (ref 70–99)
Potassium: 4.4 mEq/L (ref 3.5–5.1)
Sodium: 141 mEq/L (ref 135–145)
Total Bilirubin: 0.4 mg/dL (ref 0.2–1.2)
Total Protein: 7.6 g/dL (ref 6.0–8.3)

## 2022-12-14 LAB — MICROALBUMIN / CREATININE URINE RATIO
Creatinine,U: 259.8 mg/dL
Microalb Creat Ratio: 0.6 mg/g (ref 0.0–30.0)
Microalb, Ur: 1.5 mg/dL (ref 0.0–1.9)

## 2022-12-14 LAB — HEMOGLOBIN A1C: Hgb A1c MFr Bld: 6.1 % (ref 4.6–6.5)

## 2022-12-14 NOTE — Progress Notes (Signed)
Established Patient Office Visit  Subjective   Patient ID: Jared Colon, male    DOB: [birth year]  Age: 37 y.o. MRN: [id number]  Chief Complaint  Patient presents with   Arm Pain    Comes and goes, been going on for about a week, right arm     Right elbow pain: Symptom onset 1 day ago.  Denies any traumatic event preceding symptom onset.  Pain initially much more severe but is getting progressively better.  Located to the lateral aspect of elbow, tender to touch, intermittent. Worsens with flexion. Denies sensory changes/weakness. 3/10. Improves with tylenol.   Has also been noted to be prediabetic in the past, due to have A1c checked today (last A1C 6.3).  Has had mildly decreased GFR in the 70s.  Due to have this rechecked.     ROS: see hpi    Objective:     BP 138/80   Pulse 85   Temp 98.2 F (36.8 C) (Temporal)   Ht 6\' 1"  (1.854 m)   Wt 263 lb 8 oz (119.5 kg)   SpO2 98%   BMI 34.76 kg/m    Physical Exam Vitals reviewed.  Constitutional:      Appearance: Normal appearance.  HENT:     Head: Normocephalic and atraumatic.  Cardiovascular:     Rate and Rhythm: Normal rate and regular rhythm.  Pulmonary:     Effort: Pulmonary effort is normal.     Breath sounds: Normal breath sounds.  Musculoskeletal:     Right elbow: No swelling, deformity, effusion or lacerations. Normal range of motion. Tenderness present in medial epicondyle.     Cervical back: Neck supple.  Skin:    General: Skin is warm and dry.  Neurological:     Mental Status: He is alert and oriented to person, place, and time.     Sensory: Sensation is intact.     Motor: Motor function is intact.  Psychiatric:        Mood and Affect: Mood normal.        Behavior: Behavior normal.        Thought Content: Thought content normal.        Judgment: Judgment normal.      No results found for any visits on 12/14/22.    The ASCVD Risk score (Arnett DK, et al., 2019) failed to calculate for the  following reasons:   The 2019 ASCVD risk score is only valid for ages 45 to 77    Assessment & Plan:   Problem List Items Addressed This Visit       Other   Prediabetes    Chronic Check A1c. Further recommendations may be made based on results Follow-up in 3 months      Relevant Orders   Hemoglobin A1c   Decreased GFR    Chronic Check metabolic panel and urine for albuminuria Further recommendations may be made based on the results follow-up in 3 months      Relevant Orders   Comprehensive metabolic panel   Microalbumin / creatinine urine ratio   Right elbow pain - Primary    Acute, getting progressively better with time. Suspect tendinitis versus bursitis No sign of infection, swelling (thus DVT low on my list of differentials), pulses intact, sensation and strength intact, range of motion intact Check x-ray today, further recommendations may be made based upon his results. Continue to treat with rest, ice, compression, as needed Tylenol (told not to exceed 3000 mg in 24 hours.).  Will refer to sports medicine in the event symptoms do not improve over the next week.      Relevant Orders   Comprehensive metabolic panel   Hemoglobin A1c   Microalbumin / creatinine urine ratio   Ambulatory referral to Sports Medicine   DG Elbow 2 Views Right    Return in about 3 months (around 03/16/2023) for F/U with Zayn Selley.    Elenore Paddy, NP

## 2022-12-14 NOTE — Assessment & Plan Note (Signed)
Chronic Check metabolic panel and urine for albuminuria Further recommendations may be made based on the results follow-up in 3 months

## 2022-12-14 NOTE — Assessment & Plan Note (Signed)
Chronic Check A1c. Further recommendations may be made based on results Follow-up in 3 months

## 2022-12-14 NOTE — Assessment & Plan Note (Signed)
Acute, getting progressively better with time. Suspect tendinitis versus bursitis No sign of infection, swelling (thus DVT low on my list of differentials), pulses intact, sensation and strength intact, range of motion intact Check x-ray today, further recommendations may be made based upon his results. Continue to treat with rest, ice, compression, as needed Tylenol (told not to exceed 3000 mg in 24 hours.).  Will refer to sports medicine in the event symptoms do not improve over the next week.

## 2022-12-18 NOTE — Progress Notes (Unsigned)
   Rubin Payor, PhD, LAT, ATC acting as a scribe for Clementeen Graham, MD.  Subjective:    CC: R elbow pain  HPI: Pt is a 37 y/o male c/o R elbow pain ongoing since May 2nd. No MOI.   Pertinent review of Systems: ***  Relevant historical information: ***   Objective:   There were no vitals filed for this visit. General: Well Developed, well nourished, and in no acute distress.   MSK: ***  Lab and Radiology Results No results found for this or any previous visit (from the past 72 hour(s)). No results found.    Impression and Recommendations:    Assessment and Plan: 37 y.o. male with ***.  PDMP not reviewed this encounter. No orders of the defined types were placed in this encounter.  No orders of the defined types were placed in this encounter.   Discussed warning signs or symptoms. Please see discharge instructions. Patient expresses understanding.   ***

## 2022-12-19 ENCOUNTER — Encounter: Payer: Self-pay | Admitting: Family Medicine

## 2022-12-19 ENCOUNTER — Ambulatory Visit (INDEPENDENT_AMBULATORY_CARE_PROVIDER_SITE_OTHER): Payer: Commercial Managed Care - HMO | Admitting: Family Medicine

## 2022-12-19 ENCOUNTER — Other Ambulatory Visit: Payer: Self-pay

## 2022-12-19 VITALS — BP 118/82 | HR 80 | Ht 73.0 in | Wt 261.4 lb

## 2022-12-19 DIAGNOSIS — M25521 Pain in right elbow: Secondary | ICD-10-CM | POA: Diagnosis not present

## 2022-12-19 NOTE — Patient Instructions (Signed)
Thank you for coming in today.   Therband flexbar.   I think you have medial epicondylitis.   Please use Voltaren gel (Generic Diclofenac Gel) up to 4x daily for pain as needed.  This is available over-the-counter as both the name brand Voltaren gel and the generic diclofenac gel.   Recheck in 1 month.   If not getting better we can do more including hand therapy.

## 2022-12-20 ENCOUNTER — Ambulatory Visit: Payer: Commercial Managed Care - HMO | Admitting: Nurse Practitioner

## 2023-03-21 ENCOUNTER — Ambulatory Visit: Payer: Commercial Managed Care - HMO | Admitting: Nurse Practitioner

## 2023-05-08 ENCOUNTER — Telehealth: Payer: 59 | Admitting: Neurology

## 2023-05-08 DIAGNOSIS — G4733 Obstructive sleep apnea (adult) (pediatric): Secondary | ICD-10-CM

## 2023-05-08 NOTE — Progress Notes (Signed)
Virtual Visit via Video Note  I connected with Jared Colon on 05/08/23 at  2:15 PM EDT by a video enabled telemedicine application and verified that I am speaking with the correct person using two identifiers.  Location: Patient: at his work  Provider: in the office    I discussed the limitations of evaluation and management by telemedicine and the availability of in person appointments. The patient expressed understanding and agreed to proceed.  History of Present Illness: Today, May 08, 2023 SS: Here for follow-up virtually.  HST December 2023 showed moderate OSA.  Set up date for CPAP 09/21/2022.  Recent CPAP data 04/08/2023-05/07/23 shows 17/30 days usage at 57%, greater than 4 hours 14 days at 47%.  Average usage days used 5 hours 70 minutes. 6-14 cm.  Leak 19.1, AHI 2.4.  Some nights he falls asleep without putting his machine on.  Lately he has had issues with respiratory illness.  Using fullface mask.  When he wears his CPAP his sleep is much more restorative, more daytime energy.  Without CPAP use has tendency to have daytime fatigue.  11/06/22 Dr. Frances Furbish: Jared Colon is a 37 year old male with an underlying medical history of hyperlipidemia, prediabetes, and obesity, who presents for follow-up consultation of his obstructive sleep apnea after interim testing and starting home AutoPap therapy.  The patient is unaccompanied today.  I first met him at the request of his primary care nurse practitioner on 06/12/2022, at which time he reported snoring and excessive daytime somnolence as well as witnessed apneas.  He was advised to proceed with a sleep study.  He had a home sleep test on 07/24/2022 which indicated moderate obstructive sleep apnea with an AHI of 18.6/h, O2 nadir 89% with mild to moderate snoring detected, at times intermittent snoring.  He was advised to proceed with home AutoPap therapy.  His set up date was 09/21/2022.  His DME company is [employer].   Today, 11/06/2022: I reviewed  his AutoPap compliance data from 09/22/2022 through 10/21/2022, which is a total of 30 days, during which time he used his machine 24 days with percent use days greater than 4 hours at 63%, indicating mildly suboptimal compliance, average usage for days on treatment of 5 hours and 36 minutes, residual AHI at goal at 2/h, 95th percentile of pressure at 10.3 cm with a range of 6 to 14 cm with EPR of 3.  Leak acceptable with the 95th percentile at 13.3 L/min.  He reports doing better, he is getting over an URI and could not use his machine as consistently.  He has had some intermittent lapses in treatment in the past 2 months.  He reports still adjusting to treatment but overall feels improved, he has better sleep consolidation and less daytime somnolence.  His Epworth sleepiness score is 2 out of 24, previously was 17 out of 24.  He is very motivated to continue with treatment.  His wife also gave positive feedback and that his snoring is much less.  He uses a AirTouch fullface mask but recently the foam rim broke off.  He needs a new mask and is going to get in touch with his DME provider.  He has not changed the filter yet   Observations/Objective: Via video visit, is alert and oriented, speech is clear and concise, moves about freely  Assessment and Plan: 1.  OSA on CPAP  -Discussed the importance of nightly CPAP usage for minimum of 4 hours -We did discuss insurance requirement for minimum  of 70% utilization -We will continue current settings, send supplies as needed  Follow Up Instructions: 1 year virtually   I discussed the assessment and treatment plan with the patient. The patient was provided an opportunity to ask questions and all were answered. The patient agreed with the plan and demonstrated an understanding of the instructions.   The patient was advised to call back or seek an in-person evaluation if the symptoms worsen or if the condition fails to improve as anticipated.   Otila Kluver, DNP  [facility] [address] [place], [place] [address] [phone]

## 2023-05-08 NOTE — Patient Instructions (Signed)
Recommend using CPAP nightly for a minimum of 4 hours.  Insurance does require a minimum of 70% utilization, if not, you may be at risk of losing your machine.  Let me know if you have any questions or concerns.  I will see you back in 1 year.  Thanks!

## 2023-05-13 ENCOUNTER — Ambulatory Visit: Payer: 59 | Admitting: Podiatry

## 2023-06-28 ENCOUNTER — Encounter: Payer: Self-pay | Admitting: Nurse Practitioner

## 2023-07-08 ENCOUNTER — Other Ambulatory Visit: Payer: Self-pay | Admitting: Nurse Practitioner

## 2023-07-08 DIAGNOSIS — Z0289 Encounter for other administrative examinations: Secondary | ICD-10-CM

## 2023-07-08 MED ORDER — NALOXONE HCL 4 MG/0.1ML NA LIQD
1.0000 | Freq: Once | NASAL | 0 refills | Status: AC
Start: 2023-07-08 — End: 2023-07-08

## 2023-10-16 DIAGNOSIS — G4733 Obstructive sleep apnea (adult) (pediatric): Secondary | ICD-10-CM | POA: Diagnosis not present

## 2024-01-09 ENCOUNTER — Encounter: Payer: Self-pay | Admitting: Nurse Practitioner

## 2024-01-15 ENCOUNTER — Other Ambulatory Visit: Payer: Self-pay | Admitting: Medical Genetics

## 2024-01-17 ENCOUNTER — Encounter: Admitting: Nurse Practitioner

## 2024-01-30 ENCOUNTER — Other Ambulatory Visit (HOSPITAL_COMMUNITY)
Admission: RE | Admit: 2024-01-30 | Discharge: 2024-01-30 | Disposition: A | Discharge status: Home / Self Care | Payer: Self-pay | Source: Ambulatory Visit | Attending: Medical Genetics | Admitting: Medical Genetics

## 2024-02-09 LAB — GENECONNECT MOLECULAR SCREEN: Genetic Analysis Overall Interpretation: NEGATIVE

## 2024-03-02 DIAGNOSIS — M9915 Subluxation complex (vertebral) of pelvic region: Secondary | ICD-10-CM | POA: Diagnosis not present

## 2024-03-02 DIAGNOSIS — M9914 Subluxation complex (vertebral) of sacral region: Secondary | ICD-10-CM | POA: Diagnosis not present

## 2024-03-02 DIAGNOSIS — M9913 Subluxation complex (vertebral) of lumbar region: Secondary | ICD-10-CM | POA: Diagnosis not present

## 2024-03-11 DIAGNOSIS — M9913 Subluxation complex (vertebral) of lumbar region: Secondary | ICD-10-CM | POA: Diagnosis not present

## 2024-03-11 DIAGNOSIS — M9915 Subluxation complex (vertebral) of pelvic region: Secondary | ICD-10-CM | POA: Diagnosis not present

## 2024-03-11 DIAGNOSIS — M9914 Subluxation complex (vertebral) of sacral region: Secondary | ICD-10-CM | POA: Diagnosis not present

## 2024-03-18 DIAGNOSIS — G4733 Obstructive sleep apnea (adult) (pediatric): Secondary | ICD-10-CM | POA: Diagnosis not present

## 2024-03-20 ENCOUNTER — Encounter: Admitting: Nurse Practitioner

## 2024-04-10 ENCOUNTER — Encounter: Admitting: Nurse Practitioner

## 2024-04-15 ENCOUNTER — Encounter: Payer: Self-pay | Admitting: Podiatry

## 2024-04-15 ENCOUNTER — Ambulatory Visit (INDEPENDENT_AMBULATORY_CARE_PROVIDER_SITE_OTHER): Payer: Self-pay | Admitting: Podiatry

## 2024-04-15 VITALS — Ht 73.0 in | Wt 261.4 lb

## 2024-04-15 DIAGNOSIS — B351 Tinea unguium: Secondary | ICD-10-CM

## 2024-04-15 MED ORDER — TERBINAFINE HCL 250 MG PO TABS: 250.0000 mg | ORAL_TABLET | Freq: Every day | ORAL | 0 refills | Status: DC

## 2024-04-15 NOTE — Progress Notes (Signed)
   Chief Complaint  Patient presents with   Nail Problem    Pt is here due to left 4th toe discoloration, states the toenail has been black in color for a while, states he cuts the nail down and it grows back the same color, uses OTC medications with no relief.    Subjective: 38 y.o. male presenting today as a reestablish new patient for evaluation of discoloration with thickening to the left fourth toenail.  He has tried different topical antifungal medications with no improvement.  No past medical history on file.  Past Surgical History:  Procedure Laterality Date   NO PAST SURGERIES      No Known Allergies   LT 4th toenail 04/15/2024  Objective: Physical Exam General: The patient is alert and oriented x3 in no acute distress.  Dermatology: Hyperkeratotic, discolored, thickened, onychodystrophy noted. Skin is warm, dry and supple bilateral lower extremities. Negative for open lesions or macerations.  Vascular: Palpable pedal pulses bilaterally. No edema or erythema noted. Capillary refill within normal limits.  Neurological: Grossly intact via light touch  Musculoskeletal Exam: No pedal deformity noted  Assessment: #1 Onychomycosis of toenail left foot  Plan of Care:  -Patient was evaluated. -Today we discussed different treatment options including oral, topical, and laser antifungal treatment modalities.  We discussed their efficacies and side effects.  Patient opts for oral antifungal treatment modality -Prescription for Lamisil  250 mg #90 daily. Pt denies a history of liver pathology or symptoms.  Patient is otherwise healthy.  The patient has tried topical antifungal medication with no improvement. -Return to clinic 6 months  *[place] [employer] [place] [employer]   Thresa EMERSON Sar, DPM [facility]  Dr. Thresa EMERSON Sar, DPM    [address] [place], [place] [address]                Office [phone]  Fax [phone]

## 2024-05-08 ENCOUNTER — Encounter: Payer: Self-pay | Admitting: Nurse Practitioner

## 2024-05-08 ENCOUNTER — Other Ambulatory Visit: Payer: Self-pay | Admitting: Nurse Practitioner

## 2024-05-12 NOTE — Progress Notes (Deleted)
 Virtual Visit via Video Note  I connected with Jared Colon on 05/12/24 at  1:45 PM EDT by a video enabled telemedicine application and verified that I am speaking with the correct person using two identifiers.  Location: Patient: at his work  Provider: in the office    I discussed the limitations of evaluation and management by telemedicine and the availability of in person appointments. The patient expressed understanding and agreed to proceed.  History of Present Illness: Update 05/13/24 SS:   Today, May 08, 2023 SS: Here for follow-up virtually.  HST December 2023 showed moderate OSA.  Set up date for CPAP 09/21/2022.  Recent CPAP data 04/08/2023-05/07/23 shows 17/30 days usage at 57%, greater than 4 hours 14 days at 47%.  Average usage days used 5 hours 70 minutes. 6-14 cm.  Leak 19.1, AHI 2.4.  Some nights he falls asleep without putting his machine on.  Lately he has had issues with respiratory illness.  Using fullface mask.  When he wears his CPAP his sleep is much more restorative, more daytime energy.  Without CPAP use has tendency to have daytime fatigue.  11/06/22 Dr. Buck: Jared Colon is a 38 year old male with an underlying medical history of hyperlipidemia, prediabetes, and obesity, who presents for follow-up consultation of his obstructive sleep apnea after interim testing and starting home AutoPap therapy.  The patient is unaccompanied today.  I first met him at the request of his primary care nurse practitioner on 06/12/2022, at which time he reported snoring and excessive daytime somnolence as well as witnessed apneas.  He was advised to proceed with a sleep study.  He had a home sleep test on 07/24/2022 which indicated moderate obstructive sleep apnea with an AHI of 18.6/h, O2 nadir 89% with mild to moderate snoring detected, at times intermittent snoring.  He was advised to proceed with home AutoPap therapy.  His set up date was 09/21/2022.  His DME company is [employer].   Today,  11/06/2022: I reviewed his AutoPap compliance data from 09/22/2022 through 10/21/2022, which is a total of 30 days, during which time he used his machine 24 days with percent use days greater than 4 hours at 63%, indicating mildly suboptimal compliance, average usage for days on treatment of 5 hours and 36 minutes, residual AHI at goal at 2/h, 95th percentile of pressure at 10.3 cm with a range of 6 to 14 cm with EPR of 3.  Leak acceptable with the 95th percentile at 13.3 L/min.  He reports doing better, he is getting over an URI and could not use his machine as consistently.  He has had some intermittent lapses in treatment in the past 2 months.  He reports still adjusting to treatment but overall feels improved, he has better sleep consolidation and less daytime somnolence.  His Epworth sleepiness score is 2 out of 24, previously was 17 out of 24.  He is very motivated to continue with treatment.  His wife also gave positive feedback and that his snoring is much less.  He uses a AirTouch fullface mask but recently the foam rim broke off.  He needs a new mask and is going to get in touch with his DME provider.  He has not changed the filter yet   Observations/Objective: Via video visit, is alert and oriented, speech is clear and concise, moves about freely  Assessment and Plan: 1.  OSA on CPAP  -Discussed the importance of nightly CPAP usage for minimum of 4 hours -We did  discuss insurance requirement for minimum of 70% utilization -We will continue current settings, send supplies as needed  Follow Up Instructions: 1 year virtually   I discussed the assessment and treatment plan with the patient. The patient was provided an opportunity to ask questions and all were answered. The patient agreed with the plan and demonstrated an understanding of the instructions.   The patient was advised to call back or seek an in-person evaluation if the symptoms worsen or if the condition fails to improve as  anticipated.   Lauraine Gayland MANDES, DNP  [facility] [address] [place], [place] [address] [phone]

## 2024-05-13 ENCOUNTER — Telehealth: Payer: 59 | Admitting: Neurology

## 2024-05-14 ENCOUNTER — Other Ambulatory Visit: Payer: Self-pay

## 2024-05-19 ENCOUNTER — Other Ambulatory Visit: Payer: Self-pay | Admitting: Nurse Practitioner

## 2024-05-19 DIAGNOSIS — Z0289 Encounter for other administrative examinations: Secondary | ICD-10-CM

## 2024-05-21 ENCOUNTER — Other Ambulatory Visit: Payer: Self-pay | Admitting: Nurse Practitioner

## 2024-05-21 DIAGNOSIS — Z0289 Encounter for other administrative examinations: Secondary | ICD-10-CM

## 2024-05-21 MED ORDER — NALOXONE HCL 4 MG/0.1ML NA LIQD: 1.0000 | Freq: Once | NASAL | 1 refills | Status: AC | PRN

## 2024-05-29 ENCOUNTER — Ambulatory Visit: Admitting: Nurse Practitioner

## 2024-05-29 ENCOUNTER — Encounter: Payer: Self-pay | Admitting: Podiatry

## 2024-05-29 VITALS — BP 136/84 | HR 83 | Temp 98.3°F | Ht 73.0 in | Wt 269.0 lb

## 2024-05-29 DIAGNOSIS — Z7729 Contact with and (suspected ) exposure to other hazardous substances: Secondary | ICD-10-CM

## 2024-05-29 DIAGNOSIS — Z6835 Body mass index (BMI) 35.0-35.9, adult: Secondary | ICD-10-CM

## 2024-05-29 DIAGNOSIS — R030 Elevated blood-pressure reading, without diagnosis of hypertension: Secondary | ICD-10-CM | POA: Diagnosis not present

## 2024-05-29 DIAGNOSIS — Z0001 Encounter for general adult medical examination with abnormal findings: Secondary | ICD-10-CM

## 2024-05-29 DIAGNOSIS — Z0289 Encounter for other administrative examinations: Secondary | ICD-10-CM

## 2024-05-29 DIAGNOSIS — R7303 Prediabetes: Secondary | ICD-10-CM

## 2024-05-29 DIAGNOSIS — E669 Obesity, unspecified: Secondary | ICD-10-CM | POA: Diagnosis not present

## 2024-05-29 LAB — CBC WITH DIFFERENTIAL/PLATELET
Basophils Absolute: 0 K/uL (ref 0.0–0.1)
Basophils Relative: 0.6 % (ref 0.0–3.0)
Eosinophils Absolute: 0.2 K/uL (ref 0.0–0.7)
Eosinophils Relative: 3.2 % (ref 0.0–5.0)
HCT: 41.8 % (ref 39.0–52.0)
Hemoglobin: 13.5 g/dL (ref 13.0–17.0)
Lymphocytes Relative: 37.8 % (ref 12.0–46.0)
Lymphs Abs: 2 K/uL (ref 0.7–4.0)
MCHC: 32.4 g/dL (ref 30.0–36.0)
MCV: 85.1 fl (ref 78.0–100.0)
Monocytes Absolute: 0.5 K/uL (ref 0.1–1.0)
Monocytes Relative: 8.3 % (ref 3.0–12.0)
Neutro Abs: 2.7 K/uL (ref 1.4–7.7)
Neutrophils Relative %: 50.1 % (ref 43.0–77.0)
Platelets: 264 K/uL (ref 150.0–400.0)
RBC: 4.91 Mil/uL (ref 4.22–5.81)
RDW: 15 % (ref 11.5–15.5)
WBC: 5.4 K/uL (ref 4.0–10.5)

## 2024-05-29 LAB — LIPID PANEL
Cholesterol: 199 mg/dL (ref 0–200)
HDL: 46.4 mg/dL (ref >39.00)
LDL Cholesterol: 79 mg/dL (ref 0–99)
NonHDL: 152.64
Total CHOL/HDL Ratio: 4
Triglycerides: 368 mg/dL — ABNORMAL HIGH (ref 0.0–149.0)
VLDL: 73.6 mg/dL — ABNORMAL HIGH (ref 0.0–40.0)

## 2024-05-29 LAB — COMPREHENSIVE METABOLIC PANEL WITH GFR
ALT: 26 U/L (ref 0–53)
AST: 19 U/L (ref 0–37)
Albumin: 4.7 g/dL (ref 3.5–5.2)
Alkaline Phosphatase: 90 U/L (ref 39–117)
BUN: 11 mg/dL (ref 6–23)
CO2: 32 meq/L (ref 19–32)
Calcium: 9.4 mg/dL (ref 8.4–10.5)
Chloride: 101 meq/L (ref 96–112)
Creatinine, Ser: 1.09 mg/dL (ref 0.40–1.50)
GFR: 86.89 mL/min (ref >60.00)
Glucose, Bld: 88 mg/dL (ref 70–99)
Potassium: 4.5 meq/L (ref 3.5–5.1)
Sodium: 139 meq/L (ref 135–145)
Total Bilirubin: 0.3 mg/dL (ref 0.2–1.2)
Total Protein: 7.5 g/dL (ref 6.0–8.3)

## 2024-05-29 LAB — HEMOGLOBIN A1C: Hgb A1c MFr Bld: 6.4 % (ref 4.6–6.5)

## 2024-05-29 LAB — TSH: TSH: 1.85 u[IU]/mL (ref 0.35–5.50)

## 2024-05-29 NOTE — Assessment & Plan Note (Signed)
 Annual physical examination and preventive health maintenance Up to date with screenings and vaccinations. Tetanus vaccine due next year. Colon cancer screening at age 37, prostate cancer screening between 6-55 unless family history changes. No concerning symptoms reported. Mood stable

## 2024-05-29 NOTE — Assessment & Plan Note (Signed)
 Prediabetes Prediabetes with A1c of 6.1. Emphasized lifestyle modifications to prevent diabetes progression, including diet and exercise. - Order updated A1c. - Advise dietary modifications focusing on leafy greens, lean proteins, whole grains, and fruits. - Recommend avoiding processed carbs, sugary foods, and drinks. - Encourage exercise with a goal of at least 150 minutes per week.

## 2024-05-29 NOTE — Assessment & Plan Note (Signed)
 Labs ordered, further recommendations may be made based upon his results.

## 2024-05-29 NOTE — Progress Notes (Signed)
 Complete physical exam  Patient: Jared Colon   DOB: July 24, 1986   38 y.o. Male  MRN: [id number]  Subjective:    Chief Complaint  Patient presents with   Annual Exam    Jared Colon is a 38 y.o. male who presents today for a complete physical exam.  Discussed the use of AI scribe software for clinical note transcription with the patient, who gave verbal consent to proceed.  History of Present Illness Jared Colon is a 38 year old male who presents for an annual physical exam.  Prediabetes - Prediabetes with most recent hemoglobin A1c of 6.1%   Health Maintenance -Up-to-date on vaccines    Most recent fall risk assessment:    05/29/2024    4:07 PM  Fall Risk   Falls in the past year? 0  Number falls in past yr: 0  Injury with Fall? 0  Risk for fall due to : No Fall Risks  Follow up Falls evaluation completed     Most recent depression screenings:    05/29/2024    4:07 PM 12/14/2022    3:57 PM  PHQ 2/9 Scores  PHQ - 2 Score 0 0      No past medical history on file. Past Surgical History:  Procedure Laterality Date   NO PAST SURGERIES     Social History   Tobacco Use   Smoking status: Never   Smokeless tobacco: Never  Vaping Use   Vaping status: Never Used  Substance Use Topics   Alcohol use: Yes    Alcohol/week: 2.0 standard drinks of alcohol    Types: 2 Glasses of wine per week    Comment: One drink twice weekly   Drug use: Never   Family History  Problem Relation Age of Onset   COPD Mother    Hypertension Mother    Asthma Brother    Sleep apnea Neg Hx    No Known Allergies    Patient Care Team: Elnor Lauraine BRAVO, NP as PCP - General (Nurse Practitioner)   Outpatient Medications Prior to Visit  Medication Sig   EPINEPHrine  0.3 mg/0.3 mL IJ SOAJ injection INJECT 1 PEN IN THE MUSCLE ONE TIME AS DIRECTED   naloxone  (NARCAN ) nasal spray 4 mg/0.1 mL Place 1 spray into the nose once as needed for up to 1 dose (opioid overdose). May repeat if symptoms  of respiratory depression reoccur. Call 911 after use for follow-up care.   terbinafine  (LAMISIL ) 250 MG tablet Take 1 tablet (250 mg total) by mouth daily.   [DISCONTINUED] Multiple Vitamin (MULTIVITAMIN) tablet Take 1 tablet by mouth daily.   No facility-administered medications prior to visit.    Review of Systems  Constitutional:  Negative for chills, fever and weight loss.  Respiratory:  Negative for cough and wheezing.   Cardiovascular:  Negative for chest pain and palpitations.  Gastrointestinal:  Negative for abdominal pain and blood in stool.  Genitourinary:  Negative for dysuria and hematuria.  Psychiatric/Behavioral:  Negative for depression and suicidal ideas. The patient is not nervous/anxious.           Objective:     BP 136/84   Pulse 83   Temp 98.3 F (36.8 C) (Temporal)   Ht 6' 1 (1.854 m)   Wt 269 lb (122 kg)   SpO2 97%   BMI 35.49 kg/m  BP Readings from Last 3 Encounters:  05/29/24 136/84  12/19/22 118/82  12/14/22 138/80   Wt Readings from Last 3 Encounters:  05/29/24  269 lb (122 kg)  04/15/24 261 lb 6.4 oz (118.6 kg)  12/19/22 261 lb 6.4 oz (118.6 kg)         05/29/2024    4:07 PM 12/14/2022    3:57 PM 05/10/2022   10:15 AM  PHQ9 SCORE ONLY  PHQ-9 Total Score 0 0  1      Data saved with a previous flowsheet row definition     Physical Exam Vitals reviewed.  Constitutional:      General: He is not in acute distress.    Appearance: Normal appearance. He is not ill-appearing.  HENT:     Head: Normocephalic and atraumatic.     Right Ear: Tympanic membrane, ear canal and external ear normal.     Left Ear: Tympanic membrane, ear canal and external ear normal.  Eyes:     General: No scleral icterus.    Extraocular Movements: Extraocular movements intact.     Conjunctiva/sclera: Conjunctivae normal.     Pupils: Pupils are equal, round, and reactive to light.  Neck:     Vascular: No carotid bruit.  Cardiovascular:     Rate and Rhythm:  Normal rate and regular rhythm.     Pulses: Normal pulses.     Heart sounds: Normal heart sounds.  Pulmonary:     Effort: Pulmonary effort is normal.     Breath sounds: Normal breath sounds.  Abdominal:     General: Bowel sounds are normal. There is no distension.     Palpations: There is no mass.     Tenderness: There is no abdominal tenderness.     Hernia: No hernia is present.  Musculoskeletal:        General: No swelling or tenderness.     Cervical back: Normal range of motion and neck supple. No rigidity.  Lymphadenopathy:     Cervical: No cervical adenopathy.  Skin:    General: Skin is warm and dry.  Neurological:     General: No focal deficit present.     Mental Status: He is alert and oriented to person, place, and time.     Cranial Nerves: No cranial nerve deficit.     Sensory: No sensory deficit.     Motor: No weakness.     Gait: Gait normal.  Psychiatric:        Mood and Affect: Mood normal.        Behavior: Behavior normal.        Judgment: Judgment normal.      No results found for any visits on 05/29/24.     Assessment & Plan:    Routine Health Maintenance and Physical Exam  Immunization History  Administered Date(s) Administered   Hepatitis B, ADULT 11/16/2020   Influenza-Unspecified 05/11/2015, 04/25/2021   Moderna Covid-19 Fall Seasonal Vaccine 94yrs & older 05/08/2024   PPD Test 04/25/2021   Tdap 05/18/2015    Health Maintenance  Topic Date Due   HIV Screening  Never done   Hepatitis C Screening  Never done   HPV VACCINES (1 - 3-dose SCDM series) Never done   Hepatitis B Vaccines 19-59 Average Risk (2 of 3 - 19+ 3-dose series) 12/14/2020   DTaP/Tdap/Td (2 - Td or Tdap) 05/17/2025   Influenza Vaccine  Completed   COVID-19 Vaccine  Completed   Pneumococcal Vaccine  Aged Out   Meningococcal B Vaccine  Aged Out    Discussed health benefits of physical activity, and encouraged him to engage in regular exercise appropriate for his age  and  condition.  Problem List Items Addressed This Visit       Other   Obesity (BMI 30-39.9)   Labs ordered, further recommendations may be made based upon his results       Prediabetes   Prediabetes Prediabetes with A1c of 6.1. Emphasized lifestyle modifications to prevent diabetes progression, including diet and exercise. - Order updated A1c. - Advise dietary modifications focusing on leafy greens, lean proteins, whole grains, and fruits. - Recommend avoiding processed carbs, sugary foods, and drinks. - Encourage exercise with a goal of at least 150 minutes per week.      Relevant Orders   CBC with Differential/Platelet   Lipid panel   HgB A1c   Elevated blood pressure reading   Elevated blood pressure without diagnosis of hypertension Borderline blood pressure with systolic slightly above 130 mmHg and diastolic at 84 mmHg. No medication needed, advised close monitoring. Discussed DASH diet to lower blood pressure. - Provide handout on the DASH diet. - Advise monitoring blood pressure. - Schedule follow-up in 3 months to reassess blood pressure.      Encounter for general adult medical examination with abnormal findings   Annual physical examination and preventive health maintenance Up to date with screenings and vaccinations. Tetanus vaccine due next year. Colon cancer screening at age 16, prostate cancer screening between 57-55 unless family history changes. No concerning symptoms reported. 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 Tetanus vaccine due next year. Colon cancer screening at age 40, prostate cancer  screening between 78-55 unless family history changes. No concerning symptoms reported. Mood stable.  Prediabetes Prediabetes with A1c of 6.1. Emphasized lifestyle modifications to prevent diabetes progression, including diet and exercise. - Order updated A1c. - Advise dietary modifications focusing on leafy greens, lean proteins, whole grains, and fruits. - Recommend avoiding processed carbs, sugary foods, and drinks. - Encourage exercise with a goal of at least 150 minutes per week.  Elevated blood pressure without diagnosis of hypertension Borderline blood pressure with systolic slightly above 130 mmHg and diastolic at 84 mmHg. No medication needed, advised close monitoring. Discussed DASH diet to lower blood pressure. - Provide handout on the DASH diet. - Advise monitoring blood pressure. - Schedule follow-up in 3 months to reassess blood pressure.   Return in about 3 months (around 08/29/2024) for F/U with Timmie Dugue.     Lauraine FORBES Pereyra, NP

## 2024-05-29 NOTE — Assessment & Plan Note (Signed)
 Elevated blood pressure without diagnosis of hypertension Borderline blood pressure with systolic slightly above 130 mmHg and diastolic at 84 mmHg. No medication needed, advised close monitoring. Discussed DASH diet to lower blood pressure. - Provide handout on the DASH diet. - Advise monitoring blood pressure. - Schedule follow-up in 3 months to reassess blood pressure.

## 2024-05-31 ENCOUNTER — Ambulatory Visit: Payer: Self-pay | Admitting: Nurse Practitioner

## 2024-06-04 ENCOUNTER — Telehealth: Admitting: Nurse Practitioner

## 2024-06-04 DIAGNOSIS — R7303 Prediabetes: Secondary | ICD-10-CM

## 2024-06-04 DIAGNOSIS — E785 Hyperlipidemia, unspecified: Secondary | ICD-10-CM

## 2024-06-04 MED ORDER — METFORMIN HCL ER 500 MG PO TB24
500.0000 mg | ORAL_TABLET | Freq: Every day | ORAL | 1 refills | Status: AC
Start: 2024-06-04 — End: ?

## 2024-06-04 NOTE — Assessment & Plan Note (Signed)
 Hypertriglyceridemia (to be confirmed with fasting labs) Confirmation needed. - Ordered fasting lipid panel. - Advised 8-hour fasting before test.

## 2024-06-04 NOTE — Progress Notes (Signed)
 Established Patient Office Visit  An audio/visual tele-health visit was completed today for this patient. I connected with  Jared Colon on 06/04/24 utilizing audio/visual technology and verified that I am speaking with the correct person using two identifiers. The patient was located at their car, and I was located at the office of [facility] during the encounter. I discussed the limitations of evaluation and management by telemedicine. The patient expressed understanding and agreed to proceed.     Subjective   Patient ID: Jared Colon, male    DOB: 22-Jan-1986  Age: 38 y.o. MRN: [id number]  Chief Complaint  Patient presents with   Prediabetes    Discussed the use of AI scribe software for clinical note transcription with the patient, who gave verbal consent to proceed.  History of Present Illness Jared Colon is a 38 year old male who presents for follow-up on blood sugar levels.  Hyperglycemia and prediabetes - Hemoglobin A1c is 6.4%, consistent with prediabetes - Implementing lifestyle modifications including a diet rich in protein, vegetables, fruits, and whole grains - Previously encouraged to engage in 150 minutes of exercise per week  Hypertriglyceridemia - Triglycerides are elevated on recent laboratory testing - He was not fasting at that time          Objective:     There were no vitals taken for this visit.   Physical Exam Comprehensive physical exam not completed today as office visit was conducted remotely.  Patient appears well on video.  Patient was alert and oriented, and appeared to have appropriate judgment.   No results found for any visits on 06/04/24.    The ASCVD Risk score (Arnett DK, et al., 2019) failed to calculate for the following reasons:   The 2019 ASCVD risk score is only valid for ages 27 to 68    Assessment & Plan:   Problem List Items Addressed This Visit       Other   Hyperlipidemia    Hypertriglyceridemia (to be confirmed with fasting labs) Confirmation needed. - Ordered fasting lipid panel. - Advised 8-hour fasting before test.      Relevant Orders   Lipid panel   Comprehensive metabolic panel with GFR   Prediabetes - Primary   Prediabetes A1c at 6.4 indicates prediabetes. Aim to prevent diabetes progression. - Discussed lifestyle modifications: diet with protein, vegetables, fruits, whole grains, and 150 minutes of exercise weekly. - Introduced metformin to delay diabetes onset. - Explained metformin's action and side effects: upset stomach, nausea, diarrhea. - Prescribed metformin extended release, once daily with first meal. - Advised reporting side effects like diarrhea or upset stomach. - Instructed to withhold metformin during dehydration or illness with vomiting or diarrhea.      Relevant Medications   metFORMIN (GLUCOPHAGE-XR) 500 MG 24 hr tablet   Other Relevant Orders   Hemoglobin A1c   Assessment and Plan Assessment & Plan Prediabetes A1c at 6.4 indicates prediabetes. Aim to prevent diabetes progression. - Discussed lifestyle modifications: diet with protein, vegetables, fruits, whole grains, and 150 minutes of exercise weekly. - Introduced metformin to delay diabetes onset. - Explained metformin's action and side effects: upset stomach, nausea, diarrhea. - Prescribed metformin extended release, once daily with first meal. - Advised reporting side effects like diarrhea or upset stomach. - Instructed to withhold metformin during dehydration or illness with vomiting or diarrhea.  Hypertriglyceridemia (to be confirmed with fasting labs) Confirmation needed. - Ordered fasting lipid panel. - Advised 8-hour fasting before test.  Return in about 3 months (around 09/04/2024) for F/U with Lauraine.    Lauraine FORBES Pereyra, NP

## 2024-06-04 NOTE — Assessment & Plan Note (Signed)
 Prediabetes A1c at 6.4 indicates prediabetes. Aim to prevent diabetes progression. - Discussed lifestyle modifications: diet with protein, vegetables, fruits, whole grains, and 150 minutes of exercise weekly. - Introduced metformin to delay diabetes onset. - Explained metformin's action and side effects: upset stomach, nausea, diarrhea. - Prescribed metformin extended release, once daily with first meal. - Advised reporting side effects like diarrhea or upset stomach. - Instructed to withhold metformin during dehydration or illness with vomiting or diarrhea.

## 2024-08-02 ENCOUNTER — Other Ambulatory Visit: Payer: Self-pay | Admitting: Podiatry

## 2024-09-11 ENCOUNTER — Ambulatory Visit: Admitting: Nurse Practitioner
